# Patient Record
Sex: Male | Born: 1955 | Race: White | Hispanic: No | Smoking: Former smoker
Health system: Southern US, Community
[De-identification: ages and names within clinical notes are randomized; demographics above are authoritative.]

## PROBLEM LIST (undated history)

## (undated) DIAGNOSIS — I5020 Unspecified systolic (congestive) heart failure: Secondary | ICD-10-CM

## (undated) DIAGNOSIS — E785 Hyperlipidemia, unspecified: Secondary | ICD-10-CM

## (undated) DIAGNOSIS — I2510 Atherosclerotic heart disease of native coronary artery without angina pectoris: Secondary | ICD-10-CM

## (undated) DIAGNOSIS — C801 Malignant (primary) neoplasm, unspecified: Secondary | ICD-10-CM

## (undated) DIAGNOSIS — I10 Essential (primary) hypertension: Secondary | ICD-10-CM

## (undated) DIAGNOSIS — R9389 Abnormal findings on diagnostic imaging of other specified body structures: Secondary | ICD-10-CM

## (undated) DIAGNOSIS — I471 Supraventricular tachycardia, unspecified: Secondary | ICD-10-CM

## (undated) DIAGNOSIS — M1990 Unspecified osteoarthritis, unspecified site: Secondary | ICD-10-CM

## (undated) HISTORY — PX: HERNIA REPAIR: SHX51

## (undated) HISTORY — PX: VASECTOMY: SHX75

## (undated) HISTORY — PX: TONSILLECTOMY: SUR1361

---

## 2004-09-26 ENCOUNTER — Encounter: Admission: RE | Admit: 2004-09-26 | Discharge: 2004-09-26 | Payer: Self-pay | Admitting: Family Medicine

## 2011-12-25 ENCOUNTER — Emergency Department (HOSPITAL_COMMUNITY): Payer: BC Managed Care – PPO

## 2011-12-25 ENCOUNTER — Encounter: Payer: Self-pay | Admitting: Emergency Medicine

## 2011-12-25 ENCOUNTER — Other Ambulatory Visit: Payer: Self-pay

## 2011-12-25 ENCOUNTER — Inpatient Hospital Stay (HOSPITAL_COMMUNITY)
Admission: EM | Admit: 2011-12-25 | Discharge: 2011-12-31 | DRG: 550 | Disposition: A | Discharge status: Home / Self Care | Payer: BC Managed Care – PPO | Attending: Internal Medicine | Admitting: Internal Medicine

## 2011-12-25 DIAGNOSIS — I471 Supraventricular tachycardia, unspecified: Secondary | ICD-10-CM

## 2011-12-25 DIAGNOSIS — Z23 Encounter for immunization: Secondary | ICD-10-CM

## 2011-12-25 DIAGNOSIS — Z85828 Personal history of other malignant neoplasm of skin: Secondary | ICD-10-CM

## 2011-12-25 DIAGNOSIS — I43 Cardiomyopathy in diseases classified elsewhere: Secondary | ICD-10-CM | POA: Diagnosis present

## 2011-12-25 DIAGNOSIS — I5021 Acute systolic (congestive) heart failure: Secondary | ICD-10-CM

## 2011-12-25 DIAGNOSIS — Z6834 Body mass index (BMI) 34.0-34.9, adult: Secondary | ICD-10-CM

## 2011-12-25 DIAGNOSIS — R911 Solitary pulmonary nodule: Secondary | ICD-10-CM | POA: Diagnosis present

## 2011-12-25 DIAGNOSIS — I2510 Atherosclerotic heart disease of native coronary artery without angina pectoris: Secondary | ICD-10-CM

## 2011-12-25 DIAGNOSIS — I509 Heart failure, unspecified: Secondary | ICD-10-CM | POA: Diagnosis present

## 2011-12-25 DIAGNOSIS — E871 Hypo-osmolality and hyponatremia: Secondary | ICD-10-CM | POA: Diagnosis not present

## 2011-12-25 DIAGNOSIS — E669 Obesity, unspecified: Secondary | ICD-10-CM

## 2011-12-25 DIAGNOSIS — I214 Non-ST elevation (NSTEMI) myocardial infarction: Principal | ICD-10-CM

## 2011-12-25 DIAGNOSIS — I429 Cardiomyopathy, unspecified: Secondary | ICD-10-CM

## 2011-12-25 DIAGNOSIS — I110 Hypertensive heart disease with heart failure: Secondary | ICD-10-CM | POA: Diagnosis present

## 2011-12-25 DIAGNOSIS — E119 Type 2 diabetes mellitus without complications: Secondary | ICD-10-CM

## 2011-12-25 DIAGNOSIS — M129 Arthropathy, unspecified: Secondary | ICD-10-CM | POA: Diagnosis present

## 2011-12-25 DIAGNOSIS — J438 Other emphysema: Secondary | ICD-10-CM | POA: Diagnosis present

## 2011-12-25 DIAGNOSIS — R079 Chest pain, unspecified: Secondary | ICD-10-CM

## 2011-12-25 DIAGNOSIS — I517 Cardiomegaly: Secondary | ICD-10-CM

## 2011-12-25 HISTORY — DX: Unspecified systolic (congestive) heart failure: I50.20

## 2011-12-25 HISTORY — DX: Unspecified osteoarthritis, unspecified site: M19.90

## 2011-12-25 HISTORY — DX: Atherosclerotic heart disease of native coronary artery without angina pectoris: I25.10

## 2011-12-25 HISTORY — DX: Essential (primary) hypertension: I10

## 2011-12-25 HISTORY — DX: Abnormal findings on diagnostic imaging of other specified body structures: R93.89

## 2011-12-25 HISTORY — DX: Supraventricular tachycardia: I47.1

## 2011-12-25 HISTORY — DX: Hyperlipidemia, unspecified: E78.5

## 2011-12-25 HISTORY — DX: Malignant (primary) neoplasm, unspecified: C80.1

## 2011-12-25 HISTORY — DX: Supraventricular tachycardia, unspecified: I47.10

## 2011-12-25 LAB — CBC
HCT: 40.7 % (ref 39.0–52.0)
MCH: 32.9 pg (ref 26.0–34.0)
MCV: 89.8 fL (ref 78.0–100.0)
Platelets: 211 10*3/uL (ref 150–400)
RDW: 12.3 % (ref 11.5–15.5)
WBC: 10.2 10*3/uL (ref 4.0–10.5)

## 2011-12-25 LAB — GLUCOSE, CAPILLARY: Glucose-Capillary: 318 mg/dL — ABNORMAL HIGH (ref 70–99)

## 2011-12-25 LAB — DIFFERENTIAL
Basophils Absolute: 0 10*3/uL (ref 0.0–0.1)
Eosinophils Absolute: 0 10*3/uL (ref 0.0–0.7)
Eosinophils Relative: 0 % (ref 0–5)
Lymphocytes Relative: 13 % (ref 12–46)
Lymphs Abs: 1.3 10*3/uL (ref 0.7–4.0)
Monocytes Absolute: 0.7 10*3/uL (ref 0.1–1.0)

## 2011-12-25 LAB — CARDIAC PANEL(CRET KIN+CKTOT+MB+TROPI)
CK, MB: 4.9 ng/mL — ABNORMAL HIGH (ref 0.3–4.0)
CK, MB: 6.1 ng/mL (ref 0.3–4.0)
Total CK: 130 U/L (ref 7–232)
Troponin I: 3.58 ng/mL (ref <0.30)
Troponin I: 7.56 ng/mL (ref <0.30)

## 2011-12-25 LAB — BASIC METABOLIC PANEL
CO2: 24 mEq/L (ref 19–32)
Calcium: 9.5 mg/dL (ref 8.4–10.5)
Creatinine, Ser: 0.49 mg/dL — ABNORMAL LOW (ref 0.50–1.35)
GFR calc non Af Amer: >90 mL/min (ref >90)
Glucose, Bld: 295 mg/dL — ABNORMAL HIGH (ref 70–99)
Sodium: 129 mEq/L — ABNORMAL LOW (ref 135–145)

## 2011-12-25 LAB — LIPID PANEL
Cholesterol: 203 mg/dL — ABNORMAL HIGH (ref 0–200)
HDL: 44 mg/dL (ref >39)
LDL Cholesterol: 129 mg/dL — ABNORMAL HIGH (ref 0–99)
Total CHOL/HDL Ratio: 4.6 RATIO
Triglycerides: 152 mg/dL — ABNORMAL HIGH (ref <150)
VLDL: 30 mg/dL (ref 0–40)

## 2011-12-25 LAB — PRO B NATRIURETIC PEPTIDE: Pro B Natriuretic peptide (BNP): 2108 pg/mL — ABNORMAL HIGH (ref 0–125)

## 2011-12-25 MED ORDER — HEPARIN (PORCINE) IN NACL 100-0.45 UNIT/ML-% IJ SOLN: 1000.0000 [IU]/h | INTRAMUSCULAR | Status: DC

## 2011-12-25 MED ORDER — NITROGLYCERIN IN D5W 200-5 MCG/ML-% IV SOLN
10.0000 ug/min | INTRAVENOUS | Status: DC
  Administered 2011-12-25: 10 ug/min via INTRAVENOUS
  Administered 2011-12-25: 20 ug/min via INTRAVENOUS
  Administered 2011-12-25 – 2011-12-26 (×2): 30 ug/min via INTRAVENOUS
  Administered 2011-12-28: 20 ug/min via INTRAVENOUS
  Filled 2011-12-25 (×3): qty 250

## 2011-12-25 MED ORDER — HEPARIN SOD (PORCINE) IN D5W 100 UNIT/ML IV SOLN
INTRAVENOUS | Status: AC
  Administered 2011-12-25: 25000 [IU]
  Filled 2011-12-25: qty 250

## 2011-12-25 MED ORDER — FUROSEMIDE 10 MG/ML IJ SOLN
40.0000 mg | Freq: Once | INTRAMUSCULAR | Status: AC
  Administered 2011-12-25: 40 mg via INTRAVENOUS
  Filled 2011-12-25: qty 4

## 2011-12-25 MED ORDER — ROSUVASTATIN CALCIUM 40 MG PO TABS
40.0000 mg | ORAL_TABLET | Freq: Every day | ORAL | Status: DC
  Administered 2011-12-25 – 2011-12-30 (×6): 40 mg via ORAL
  Filled 2011-12-25 (×7): qty 1

## 2011-12-25 MED ORDER — HEPARIN SOD (PORCINE) IN D5W 100 UNIT/ML IV SOLN
1250.0000 [IU]/h | INTRAVENOUS | Status: DC
  Administered 2011-12-26: 1250 [IU]/h via INTRAVENOUS
  Filled 2011-12-25 (×2): qty 250

## 2011-12-25 MED ORDER — METOPROLOL TARTRATE 1 MG/ML IV SOLN
INTRAVENOUS | Status: AC
  Administered 2011-12-25: 2.5 mg via INTRAVENOUS
  Filled 2011-12-25: qty 5

## 2011-12-25 MED ORDER — OLMESARTAN 10 MG HALF TABLET
10.0000 mg | ORAL_TABLET | Freq: Every day | ORAL | Status: DC
  Administered 2011-12-25 – 2011-12-30 (×6): 10 mg via ORAL
  Filled 2011-12-25 (×7): qty 1

## 2011-12-25 MED ORDER — METOPROLOL TARTRATE 1 MG/ML IV SOLN
2.5000 mg | INTRAVENOUS | Status: DC | PRN
  Administered 2011-12-25: 2.5 mg via INTRAVENOUS

## 2011-12-25 MED ORDER — NITROGLYCERIN IN D5W 200-5 MCG/ML-% IV SOLN
INTRAVENOUS | Status: AC
  Administered 2011-12-25: 20 ug/min via INTRAVENOUS
  Filled 2011-12-25: qty 250

## 2011-12-25 MED ORDER — NITROGLYCERIN IN D5W 200-5 MCG/ML-% IV SOLN
INTRAVENOUS | Status: AC
  Filled 2011-12-25: qty 250

## 2011-12-25 MED ORDER — INSULIN ASPART 100 UNIT/ML ~~LOC~~ SOLN
0.0000 [IU] | Freq: Three times a day (TID) | SUBCUTANEOUS | Status: DC
  Administered 2011-12-26: 8 [IU] via SUBCUTANEOUS
  Administered 2011-12-26: 15 [IU] via SUBCUTANEOUS
  Administered 2011-12-26 – 2011-12-27 (×2): 5 [IU] via SUBCUTANEOUS
  Administered 2011-12-28 (×2): 11 [IU] via SUBCUTANEOUS
  Administered 2011-12-28: 5 [IU] via SUBCUTANEOUS
  Administered 2011-12-29: 11 [IU] via SUBCUTANEOUS
  Administered 2011-12-29: 3 [IU] via SUBCUTANEOUS
  Administered 2011-12-29: 5 [IU] via SUBCUTANEOUS
  Administered 2011-12-30: 8 [IU] via SUBCUTANEOUS
  Administered 2011-12-30: 5 [IU] via SUBCUTANEOUS
  Administered 2011-12-30: 8 [IU] via SUBCUTANEOUS
  Administered 2011-12-31: 3 [IU] via SUBCUTANEOUS
  Administered 2011-12-31: 8 [IU] via SUBCUTANEOUS

## 2011-12-25 MED ORDER — ASPIRIN EC 81 MG PO TBEC
81.0000 mg | DELAYED_RELEASE_TABLET | Freq: Every day | ORAL | Status: DC
  Administered 2011-12-26 – 2011-12-31 (×6): 81 mg via ORAL
  Filled 2011-12-25 (×6): qty 1

## 2011-12-25 MED ORDER — HEPARIN SOD (PORCINE) IN D5W 100 UNIT/ML IV SOLN
INTRAVENOUS | Status: AC
  Filled 2011-12-25: qty 250

## 2011-12-25 MED ORDER — INFLUENZA VIRUS VACC SPLIT PF IM SUSP
0.5000 mL | INTRAMUSCULAR | Status: AC
  Administered 2011-12-26: 0.5 mL via INTRAMUSCULAR
  Filled 2011-12-25: qty 0.5

## 2011-12-25 MED ORDER — METOPROLOL TARTRATE 1 MG/ML IV SOLN
2.5000 mg | INTRAVENOUS | Status: AC
  Administered 2011-12-25 (×2): 2.5 mg via INTRAVENOUS
  Filled 2011-12-25: qty 5

## 2011-12-25 MED ORDER — CARVEDILOL 6.25 MG PO TABS: 6.2500 mg | ORAL_TABLET | Freq: Two times a day (BID) | ORAL | Status: DC

## 2011-12-25 MED ORDER — SODIUM CHLORIDE 0.9 % IV SOLN: INTRAVENOUS | Status: DC

## 2011-12-25 MED ORDER — METOPROLOL TARTRATE 1 MG/ML IV SOLN
5.0000 mg | Freq: Once | INTRAVENOUS | Status: DC
  Filled 2011-12-25: qty 5

## 2011-12-25 MED ORDER — INSULIN ASPART 100 UNIT/ML ~~LOC~~ SOLN
0.0000 [IU] | Freq: Every day | SUBCUTANEOUS | Status: DC
  Administered 2011-12-25: 4 [IU] via SUBCUTANEOUS
  Administered 2011-12-26 – 2011-12-30 (×4): 3 [IU] via SUBCUTANEOUS
  Filled 2011-12-25 (×2): qty 3

## 2011-12-25 MED ORDER — ACETAMINOPHEN 325 MG PO TABS
650.0000 mg | ORAL_TABLET | ORAL | Status: DC | PRN
  Administered 2011-12-26: 650 mg via ORAL
  Filled 2011-12-25: qty 2

## 2011-12-25 MED ORDER — ZOLPIDEM TARTRATE 5 MG PO TABS
5.0000 mg | ORAL_TABLET | Freq: Every evening | ORAL | Status: DC | PRN
  Administered 2011-12-25 – 2011-12-29 (×5): 5 mg via ORAL
  Filled 2011-12-25 (×5): qty 1

## 2011-12-25 MED ORDER — METOPROLOL TARTRATE 12.5 MG HALF TABLET
12.5000 mg | ORAL_TABLET | Freq: Two times a day (BID) | ORAL | Status: DC
  Administered 2011-12-25 – 2011-12-26 (×3): 12.5 mg via ORAL
  Filled 2011-12-25 (×5): qty 1

## 2011-12-25 MED ORDER — NITROGLYCERIN 0.4 MG SL SUBL: 0.4000 mg | SUBLINGUAL_TABLET | SUBLINGUAL | Status: DC | PRN

## 2011-12-25 MED ORDER — IOHEXOL 350 MG/ML SOLN
100.0000 mL | Freq: Once | INTRAVENOUS | Status: AC | PRN
  Administered 2011-12-25: 100 mL via INTRAVENOUS

## 2011-12-25 MED ORDER — ALPRAZOLAM 0.25 MG PO TABS
0.2500 mg | ORAL_TABLET | Freq: Two times a day (BID) | ORAL | Status: DC | PRN
  Administered 2011-12-27 (×2): 0.25 mg via ORAL
  Filled 2011-12-25: qty 1

## 2011-12-25 MED ORDER — ONDANSETRON HCL 4 MG/2ML IJ SOLN: 4.0000 mg | Freq: Four times a day (QID) | INTRAMUSCULAR | Status: DC | PRN

## 2011-12-25 NOTE — Progress Notes (Signed)
CRITICAL VALUE ALERT  Critical value received:  Troponin 7.56, CKMB 6.1 Date of notification:  12/24/10  Time of notification:  1730  Critical value read back: YES  Nurse who received alert:  A Cliffard Hair RN  MD notified (1st page):  Graciela Husbands  Time of first page:  1730  MD notified (2nd page):  Time of second page:  Responding MD:  Graciela Husbands  Time MD responded: [phone]

## 2011-12-25 NOTE — ED Notes (Signed)
Pt reports starting to feel better. Less diaphoretic, denies pain. Family at bedside. Waiting for bed assignment to floor. No needs at this time.

## 2011-12-25 NOTE — ED Notes (Signed)
[name], [name] at bedside. Pt stating it is hard to breathe. O2 Sats increased to 4L [place]. O2 Sats 95%. Pt denies chest pain.

## 2011-12-25 NOTE — Progress Notes (Signed)
Patient ID: [name], male   DOB: Oct 15, 1956, 56 y.o.   MRN: [id number] Patient looks much more stable. No current chest pain. Enzymes are modestly elevated, but CKMB only 6.  EF 20%-25% with global hypokinesis.   Will continue diuresis, and add low dose beta blockade.   Defer cath for now until hemodynamics are stabilized.  He prob has HTN related LV dysfunction plus or minus viral component of myocarditis. Cannot exclude CAD, and will need cath study.   6:32 PM Shawnie Pons 12/25/2011

## 2011-12-25 NOTE — ED Notes (Signed)
Troponin 3.58 per Fayrene Fearing, [employer]. Will inform MD Graciela Husbands.

## 2011-12-25 NOTE — ED Notes (Signed)
cbg -318 on glucometer.

## 2011-12-25 NOTE — Progress Notes (Signed)
Pt SOB improved after IV lasix. Patient has diuresed approximately 1/2 L. He was also placed on a nonrebreather mask. Currently he feels that his respiratory status is significantly improved. He is no longer diaphoretic. He is maintaining O2 sats at 100%. Preliminary echocardiogram results show an EF of 10-20%. These results were reviewed by Dr. Graciela Husbands who re-assessed the patient.  [name] has acute systolic CHF. He has had IV nitroglycerin started which will be up titrated for better blood pressure control. 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 Right and left heart cath can be considered at a later date, once his general medical condition is improved.  He has been placed on a diabetic diet because of a glucose of 295. Hemoglobin A1c is pending. Will use sliding scale insulin as well.  We will continue to follow him closely.

## 2011-12-25 NOTE — Progress Notes (Signed)
ANTICOAGULATION CONSULT NOTE - Initial Consult  Pharmacy Consult for: Heparin Indication: chest pain/ACS  No Known Allergies  Patient Measurements: Height: 5\' 10"  (177.8 cm) Weight: 236 lb 12.4 oz (107.4 kg) IBW/kg (Calculated) : 73  Heparin dosing weight: 96kg  Vital Signs: Temp: 98.7 F (37.1 C) (01/02 1955) Temp src: Oral (01/02 1955) BP: 130/70 mmHg (01/02 1800) Pulse Rate: 117  (01/02 1900)  Labs:  Basename 12/25/11 1633 12/25/11 0904 12/25/11 0731  HGB -- -- 14.9  HCT -- -- 40.7  PLT -- -- 211  APTT -- -- --  LABPROT -- -- --  INR -- -- --  HEPARINUNFRC 0.12* -- --  CREATININE -- -- 0.49*  CKTOTAL 130 159 --  CKMB 6.1* 4.9* --  TROPONINI 7.56* 3.58* --   Estimated Creatinine Clearance: 126.6 ml/min (by C-G formula based on Cr of 0.49).  Medical History: Past Medical History  Diagnosis Date  . 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 Hypertension     9 yrs ago, stopped smoking and B/P went down  . Arthritis     Medications:  Prescriptions prior to admission  Medication Sig Dispense Refill  . aspirin 81 MG chewable tablet Chew 324 mg by mouth once.        . Loratadine-Pseudoephedrine (CLARITIN-D 12 HOUR PO) Take 1 tablet by mouth daily.        . nitroGLYCERIN (NITROSTAT) 0.4 MG SL tablet Place 0.4 mg under the tongue every 5 (five) minutes as needed. Chest pain         Assessment: 55yom started on heparin in the ED for CP. His initial Heparin level has come back at 0.12 which is sub-therapeutic and below desired goal range.  He is without noted bleeding complications with therapy.  His cardiac enzymes were elevated and he has had CHF exacerbation and currently being diuresed.  Goal of Therapy:  Heparin level 0.3-0.7 units/ml   Plan 1)  Will increase IV heparin to 1200 units/hr for now 2)  Check AM heparin level and adjust as needed. 3)  F/U plans for continued IV heparin therapy 4)  Monitor for signs of bleeding   Nadara Mustard PharmD., MS Clinical  Pharmacist Pager:  [phone] 12/25/2011,8:48 PM

## 2011-12-25 NOTE — ED Notes (Signed)
Pt c/o SOB. O2 increased. Pt diaphoretic.  Alan Ripper, [facility] aware and at bedside with pt.

## 2011-12-25 NOTE — Progress Notes (Signed)
  Echocardiogram 2D Echocardiogram has been performed.  Juanita Laster Saje Gallop, RDCS 12/25/2011, 12:13 PM

## 2011-12-25 NOTE — H&P (Signed)
History and Physical  Patient ID: [name] Patient ID: [name] MRN: [id number], DOB/AGE: 57-Aug-1957 57 y.o. Date of Encounter: 12/25/2011  Primary Physician: [facility] Primary Cardiologist: none  Chief Complaint: chest pain  HPI: [name] patient is a 71 her old male with no previous history of coronary artery disease. Over the last 2 weeks or so, he has felt general malaise and bodyaches but no fever. He has also had nasal congestion. He attributed his symptoms to possibly a mild case of the flu or allergies. His appetite has also been poor. Last p.m. he was actually feeling a little better and says he well. The medial included some ham but he does not feel that he had an excessive salt load.  The patient awakened at approximately midnight with shortness of breath. The shortness of breath was severe. He then developed chest tightness and pressure at a 9/10. He was also diaphoretic. He had some nausea but no vomiting. When his symptoms did not resolve, he called EMS. EMS gave him aspirin 81 mg x4 and sublingual nitroglycerin x2. By the time he arrived in the emergency room, his chest pain was relieved. Since being in the emergency room, his chest pain has not returned. He is on heparin, oxygen and has received IV Lopressor. His shortness of breath has resolved. He is no longer diaphoretic or nauseated.   He has never had these symptoms before. The only similar episode, was several years ago in the setting of a severe chest cold. Once his chest cold improved, all other symptoms resolved. 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 Currently he is resting comfortably.  Past medical history.  1. Hypertension: He was previously hypertensive but he quit smoking and lost 30 pounds so his hypertension improved to the point that medication was not require. 2. Cholesterol: His cholesterol  was checked about a year ago and the patient states that he was never told it was high.   Surgical History: History reviewed. No pertinent past surgical history.   Meds PTA:  Claritin D daily PRN  Allergies: No Known Allergies  History   Social History  . Marital Status: Divorced    Spouse Name: N/A    Number of Children: N/A  . Years of Education: N/A   Occupational History  . IT   Social History Main Topics  . Smoking status:  he quit smoking 9 years ago   . Smokeless tobacco: Not on file  . Alcohol Use: No abuse   . Drug Use: No  . Sexually Active: Not on file   Other Topics Concern  . Not on file   Social History Narrative  .  lives with significant other     Cardiovascular ROS: negative for - other organ systems apart from was listed above  Family history. His mother is alive in her late 15s without cardiac issues. His father died in his 63s from cirrhosis with no cardiac issues. He has brothers with hypertension but no cardiac issues. He has a grandmother who had a heart attack in her 26s.  Physical Exam: Blood pressure 160/93, pulse 118, temperature 98.1 F (36.7 C), temperature source Oral, resp. rate 19, SpO2 96.00%. General: Well developed, overweight white male, in no acute distress. Head: Normocephalic, atraumatic, sclera non-icteric, no xanthomas, nares are without discharge.  Neck: Negative for carotid bruits. JVD not elevated, but difficult  to assess secondary to body habitus. No thyromegally Lungs: He has bibasilar Rales. Breathing is unlabored. Heart: Rapid but RR with S1 S2. No murmurs, rubs, or gallops appreciated. Abdomen: Soft, non-tender, non-distended with normoactive bowel sounds. No hepatomegaly. No rebound/guarding. No obvious abdominal masses. Msk:  Strength and tone appear normal for age. Extremities: No clubbing or cyanosis. No edema.  Distal pedal pulses are 2+ and equal bilaterally. Neuro: Alert and oriented X 3. Moves all extremities  spontaneously. No focal deficits noted. Psych:  Responds to questions appropriately with a normal affect. Skin: No rashes or lesions noted LN  Back without kyphosis or scoliosis  Review of Systems: He has had some musculoskeletal aches and pains. He had general malaise recently. He has not had fevers or chills. He has had a poor appetite recently. He is not aware of any edema or weight loss. He states that his legs have been feeling cold recently. He has not had any cough or cold symptoms but has had nasal congestion. All other systems reviewed and are otherwise negative except as noted above.  Labs:   Lab Results  Component Value Date   WBC 10.2 12/25/2011   HGB 14.9 12/25/2011   HCT 40.7 12/25/2011   MCV 89.8 12/25/2011   PLT 211 12/25/2011    Lab 12/25/11 0731  NA 129*  K 4.5  CL 92*  CO2 24  BUN 10  CREATININE 0.49*  CALCIUM 9.5  PROT --  BILITOT --  ALKPHOS --  ALT --  AST --  GLUCOSE 295*   POC Troponin 2.63, repeat enzymes pending.   Radiology/Studies: pending   EKG: ST, lateral ST depression, no old available  ASSESSMENT AND PLAN:  1. chest pain: Brian. English is a 56 year old male with no previous history of coronary artery disease who had prolonged chest pain and has an elevated troponin, repeat enzymes pending. The risks and benefits of a cardiac catheterization including, but not limited to, death, stroke, MI, kidney damage and bleeding were discussed with the patient who indicates understanding and agrees to proceed. Cardiac catheterization is planned for tomorrow.  2. Shortness of breath: Because he is consistently tachycardic and his symptoms began with shortness of breath, CT angiogram of the chest will be performed to rule out PE.  3. Hyperglycemia: We will check a hemoglobin A1c. We will start him on a diabetic diet and encourage him to followup with primary care.  4. Cholesterol: His cholesterol status has not been checked recently. We will check a lipid profile  follow up on those results.  Plan: If the CT angiogram is negative for PE, he'll be admitted by cardiology.     Signed, Bjorn Loser Barrett PA-C 12/25/2011, 8:47 AM  The patient has concerning chest pain with a troponin of 2.6 her initial evaluation. He is currently pain-free. His tachycardia is bothersome and shortness of breath was his initial symptoms. As such, we will exclude pulmonary embolism and continue anti-coagulant therapy. If CT scan is negative, we'll pursue catheterization probably in the morning if he remains pain-free. His electrocardiogram has evidence of ST segment elevation in leads V1-V3 admission to the same in leads 3. The former could  be J-point elevation/early repolarization; this will become clear.  Risk factor modification will be important. He does not smoke. He is significantly obese. His elevated blood sugar and will need diabetes education. Will need to assess his lipid profile.  For now, we will continue him on aspirin, heparin, beta blockers, statins therapy and began augmented  antiplatelet therapy depending on the results of the CT scan

## 2011-12-25 NOTE — ED Provider Notes (Signed)
History     CSN: [id number]  Arrival date & time 12/25/11  4098   First MD Initiated Contact with Patient 12/25/11 [phone]      Chief Complaint  Patient presents with  . Chest Pain    (Consider location/radiation/quality/duration/timing/severity/associated sxs/prior treatment) HPI Comments: Patient reports that at midnight last evening he began having chest pain and shortness of breath.  He describes the pain as a pressure.  The pain did not radiate.  He reports that the pain was associated with shortness of breath and diaphoresis.  This morning he called EMS and was brought to the ED.  He was given Aspirin 324 mg and 2 Nitroglycerin by EMS, which helped his symptoms.  He reports that prior to the medication his chest pain was a 8/10 and now it is less than a 1/10.  He states that he does not have any cardiac illnesses.  He states that he had borderline hypertension in the past, but has been controlling it with diet.  He is a previous smoker.  He smoked 1ppd for approximately 30 years. He quit smoking 9 years ago.  He does not have a PCP or a Cardiologist.   He states that he does not a family history of MI or stroke.  He does have a family history of HTN.  Patient is a 56 y.o. male presenting with chest pain. The history is provided by the patient.  Chest Pain Episode onset: 7 hours ago. Duration of episode(s) is 6 hours. Chest pain occurs constantly. At its most intense, the pain is at 8/10. The pain is currently at 1/10. The quality of the pain is described as pressure-like. The pain does not radiate. Primary symptoms include shortness of breath and cough. Pertinent negatives for primary symptoms include no fever, no nausea, no vomiting and no dizziness.  Associated symptoms include diaphoresis.  Pertinent negatives for associated symptoms include no lower extremity edema, no near-syncope, no numbness and no weakness. Risk factors include male gender and obesity.  Pertinent negatives for past  medical history include no CAD, no COPD, no hypertension, no MI and no PE.     History reviewed. No pertinent past medical history.  History reviewed. No pertinent past surgical history.  History reviewed. No pertinent family history.  History  Substance Use Topics  . Smoking status: Not on file  . Smokeless tobacco: Not on file  . Alcohol Use: Not on file      Review of Systems  Constitutional: Positive for diaphoresis. Negative for fever and chills.  HENT: Negative for neck pain and neck stiffness.   Respiratory: Positive for cough and shortness of breath.   Cardiovascular: Positive for chest pain. Negative for near-syncope.  Gastrointestinal: Negative for nausea and vomiting.  Neurological: Negative for dizziness, weakness, light-headedness and numbness.  Psychiatric/Behavioral: Negative for confusion.    Allergies  Review of patient's allergies indicates not on file.  Home Medications  No current outpatient prescriptions on file.  BP 196/112  Pulse 133  Temp(Src) 98.1 F (36.7 C) (Oral)  Resp 22  SpO2 96%  Physical Exam  Constitutional: He is oriented to person, place, and time. He appears well-developed and well-nourished. No distress.  HENT:  Head: Normocephalic and atraumatic.  Neck: Normal range of motion. Neck supple.  Cardiovascular: Normal rate, regular rhythm and normal heart sounds.   Pulmonary/Chest: Effort normal and breath sounds normal. No respiratory distress. He has no wheezes.  Abdominal: Soft. Bowel sounds are normal. There is no tenderness.  Musculoskeletal: Normal range of motion.  Neurological: He is alert and oriented to person, place, and time.  Skin: Skin is warm and dry.  Psychiatric: He has a normal mood and affect.    ED Course  Procedures (including critical care time)  Results for orders placed during the hospital encounter of 12/25/11  CBC      Component Value Range   WBC 10.2  4.0 - 10.5 (K/uL)   RBC 4.53  4.22 - 5.81  (MIL/uL)   Hemoglobin 14.9  13.0 - 17.0 (g/dL)   HCT 16.1  09.6 - 04.5 (%)   MCV 89.8  78.0 - 100.0 (fL)   MCH 32.9  26.0 - 34.0 (pg)   MCHC 36.6 (*) 30.0 - 36.0 (g/dL)   RDW 40.9  81.1 - 91.4 (%)   Platelets 211  150 - 400 (K/uL)  DIFFERENTIAL      Component Value Range   Neutrophils Relative 80 (*) 43 - 77 (%)   Neutro Abs 8.2 (*) 1.7 - 7.7 (K/uL)   Lymphocytes Relative 13  12 - 46 (%)   Lymphs Abs 1.3  0.7 - 4.0 (K/uL)   Monocytes Relative 7  3 - 12 (%)   Monocytes Absolute 0.7  0.1 - 1.0 (K/uL)   Eosinophils Relative 0  0 - 5 (%)   Eosinophils Absolute 0.0  0.0 - 0.7 (K/uL)   Basophils Relative 0  0 - 1 (%)   Basophils Absolute 0.0  0.0 - 0.1 (K/uL)  BASIC METABOLIC PANEL      Component Value Range   Sodium 129 (*) 135 - 145 (mEq/L)   Potassium 4.5  3.5 - 5.1 (mEq/L)   Chloride 92 (*) 96 - 112 (mEq/L)   CO2 24  19 - 32 (mEq/L)   Glucose, Bld 295 (*) 70 - 99 (mg/dL)   BUN 10  6 - 23 (mg/dL)   Creatinine, Ser 7.82 (*) 0.50 - 1.35 (mg/dL)   Calcium 9.5  8.4 - 95.6 (mg/dL)   GFR calc non Af Amer >90  >90 (mL/min)   GFR calc Af Amer >90  >90 (mL/min)  POCT I-STAT TROPONIN I      Component Value Range   Troponin i, poc 2.63 (*) 0.00 - 0.08 (ng/mL)   Comment NOTIFIED PHYSICIAN     Comment 3            No results found. '   Date: 12/25/2011  Rate: 126  Rhythm: sinus tachycardia  QRS Axis: normal  Intervals: normal  ST/T Wave abnormalities: ST depressions laterally  Conduction Disutrbances:none  Narrative Interpretation:    Old EKG Reviewed: none available  8:16 AM Discussed with Cardiology. [place] Dr. Graciela Husbands on call, will evaluate now in the Ed. CXR pending.   MDM   Chest pain with no ACS risk factors other than male gender and age. Aspirin prior to arrival. Nitroglycerin in route relieved pain. Stat EKG demonstrates ST depressions laterally as above. Patient tachycardic with shortness of breath, so an PE study was ordered initially. With elevated troponin felt to  be more likely cardiac in nature. CT was canceled and cardiology consult at and evaluated bedside. At 8:45 AM working plan is to heparin, admit to cardiology and set up for cath UA today or tomorrow. Will repeat EKG for any return of chest pain and cycle cardiac enzymes.       Pascal Lux Wingen 12/25/11 2130  Pascal Lux Wingen 12/25/11 0830  Medical screening examination/treatment/procedure(s) were conducted as a  shared visit with non-physician practitioner(s) and myself.  I personally evaluated the patient during the encounter  Sunnie Nielsen, MD 12/25/11 [phone]

## 2011-12-25 NOTE — ED Notes (Signed)
Cardiology PA at bedside. 

## 2011-12-25 NOTE — Progress Notes (Signed)
ANTICOAGULATION CONSULT NOTE - Initial Consult  Pharmacy Consult for: Heparin Indication: chest pain/ACS  No Known Allergies  Patient Measurements: Height: 5\' 10"  (177.8 cm) Weight: 236 lb 12.4 oz (107.4 kg) IBW/kg (Calculated) : 73  Heparin dosing weight: 96kg  Vital Signs: Temp: 98.3 F (36.8 C) (01/02 1101) Temp src: Oral (01/02 0757) BP: 128/74 mmHg (01/02 1300) Pulse Rate: 111  (01/02 1300)  Labs:  Basename 12/25/11 0904 12/25/11 0731  HGB -- 14.9  HCT -- 40.7  PLT -- 211  APTT -- --  LABPROT -- --  INR -- --  HEPARINUNFRC -- --  CREATININE -- 0.49*  CKTOTAL 159 --  CKMB 4.9* --  TROPONINI 3.58* --   Estimated Creatinine Clearance: 126.6 ml/min (by C-G formula based on Cr of 0.49).  Medical History: Past Medical History  Diagnosis Date  . 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 Hypertension     9 yrs ago, stopped smoking and B/P went down  . Arthritis     Medications:  Prescriptions prior to admission  Medication Sig Dispense Refill  . aspirin 81 MG chewable tablet Chew 324 mg by mouth once.        . Loratadine-Pseudoephedrine (CLARITIN-D 12 HOUR PO) Take 1 tablet by mouth daily.        . nitroGLYCERIN (NITROSTAT) 0.4 MG SL tablet Place 0.4 mg under the tongue every 5 (five) minutes as needed. Chest pain         Assessment: 55yom started on heparin in the ED for CP. Heparin started at 1000 units/hr (no bolus that I see documented and no mention of bolus given when RN took report). Given his weight, he probably will need a rate increase but will check level first.  Goal of Therapy:  Heparin level 0.3-0.7 units/ml   Plan:  1) Continue heparin at 1000 units/hr for now 2) Check 6h level (1500 today since heparin started ~0900 in ED)  Fredrik Rigger 12/25/2011,2:08 PM

## 2011-12-25 NOTE — ED Notes (Signed)
Patient woke up around midnight last night with substernal chest pain; pain progressively gotten worse over the course of the morning.  Patient also reports associated symptoms: severe dyspnea and diaphoresis.  Denies nausea, vomiting, blurred vision, and numbness/tingling in hands/feet.  Initial chest pain 8/10; describes location as "substernal"; describes chest pain as a "tight pressure".  Upon EMS arrival, patient placed on non-rebreather mask.  Patient given 324 ASA and 2 nitro by EMS.  IV started in left hand (22 GA).  Chest pain now 1/10 upon arrival to hospital.

## 2011-12-25 NOTE — ED Notes (Signed)
MD Sudie Bailey, PA at bedside. Echo Tech at bedside to perform 2D Echo.

## 2011-12-25 NOTE — Progress Notes (Signed)
Pt with recurrent sob and diaphoresis but no CP And repeat ECG continues unchanged CT no PE and no pericardial effusion   PE also shows rales with BP now 170/100 We are doing a stat echo and have discussed with INterventional about urgent cath.  I wonder whether the dye load from CT has tipped into more heart failure  Rx now with diuretics and NTG; wioll hold off on BB with CHF

## 2011-12-26 DIAGNOSIS — E119 Type 2 diabetes mellitus without complications: Secondary | ICD-10-CM

## 2011-12-26 DIAGNOSIS — E669 Obesity, unspecified: Secondary | ICD-10-CM

## 2011-12-26 DIAGNOSIS — I428 Other cardiomyopathies: Secondary | ICD-10-CM

## 2011-12-26 DIAGNOSIS — I429 Cardiomyopathy, unspecified: Secondary | ICD-10-CM | POA: Insufficient documentation

## 2011-12-26 DIAGNOSIS — I214 Non-ST elevation (NSTEMI) myocardial infarction: Secondary | ICD-10-CM

## 2011-12-26 DIAGNOSIS — I5021 Acute systolic (congestive) heart failure: Secondary | ICD-10-CM

## 2011-12-26 LAB — CBC
HCT: 37.3 % — ABNORMAL LOW (ref 39.0–52.0)
MCV: 90.5 fL (ref 78.0–100.0)
RDW: 12.4 % (ref 11.5–15.5)
WBC: 9.6 10*3/uL (ref 4.0–10.5)

## 2011-12-26 LAB — BASIC METABOLIC PANEL
BUN: 12 mg/dL (ref 6–23)
CO2: 27 mEq/L (ref 19–32)
Calcium: 9.2 mg/dL (ref 8.4–10.5)
Creatinine, Ser: 0.58 mg/dL (ref 0.50–1.35)
Glucose, Bld: 270 mg/dL — ABNORMAL HIGH (ref 70–99)
Sodium: 136 mEq/L (ref 135–145)

## 2011-12-26 LAB — HEPARIN LEVEL (UNFRACTIONATED): Heparin Unfractionated: <0.1 IU/mL — ABNORMAL LOW (ref 0.30–0.70)

## 2011-12-26 LAB — GLUCOSE, CAPILLARY: Glucose-Capillary: 352 mg/dL — ABNORMAL HIGH (ref 70–99)

## 2011-12-26 MED ORDER — SODIUM CHLORIDE 0.9 % IV SOLN
1.0000 mL/kg/h | INTRAVENOUS | Status: DC
  Administered 2011-12-27: 1.003 mL/kg/h via INTRAVENOUS

## 2011-12-26 MED ORDER — HEPARIN SOD (PORCINE) IN D5W 100 UNIT/ML IV SOLN
2000.0000 [IU]/h | INTRAVENOUS | Status: DC
  Administered 2011-12-26: 1500 [IU]/h via INTRAVENOUS
  Administered 2011-12-26: 1800 [IU]/h via INTRAVENOUS
  Administered 2011-12-27: 2000 [IU]/h via INTRAVENOUS
  Filled 2011-12-26 (×5): qty 250

## 2011-12-26 MED ORDER — SODIUM CHLORIDE 0.9 % IV SOLN: 250.0000 mL | INTRAVENOUS | Status: DC | PRN

## 2011-12-26 MED ORDER — HEPARIN BOLUS VIA INFUSION
3000.0000 [IU] | Freq: Once | INTRAVENOUS | Status: AC
  Administered 2011-12-26: 3000 [IU] via INTRAVENOUS
  Filled 2011-12-26: qty 3000

## 2011-12-26 MED ORDER — SODIUM CHLORIDE 0.9 % IJ SOLN
3.0000 mL | Freq: Two times a day (BID) | INTRAMUSCULAR | Status: DC
  Administered 2011-12-26 – 2011-12-27 (×3): 3 mL via INTRAVENOUS

## 2011-12-26 MED ORDER — SODIUM CHLORIDE 0.9 % IJ SOLN: 3.0000 mL | INTRAMUSCULAR | Status: DC | PRN

## 2011-12-26 MED ORDER — FUROSEMIDE 10 MG/ML IJ SOLN
40.0000 mg | Freq: Once | INTRAMUSCULAR | Status: AC
  Administered 2011-12-26: 40 mg via INTRAVENOUS
  Filled 2011-12-26: qty 4

## 2011-12-26 NOTE — Progress Notes (Signed)
  Patient Name: [name]      SUBJECTIVE:mcuh improved this am with less sob; no chest pain  Past Medical History  Diagnosis Date  . 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 Hypertension     9 yrs ago, stopped smoking and B/P went down  . Arthritis     PHYSICAL EXAM Filed Vitals:   12/26/11 0500 12/26/11 0600 12/26/11 0700 12/26/11 0744  BP: 120/68 125/73 117/76 117/76  Pulse: 89 94 93 100  Temp:    97.9 F (36.6 C)  TempSrc:      Resp: 17 16 12 14   Height:      Weight: 235 lb 3.7 oz (106.7 kg)     SpO2: 94% 97% 95% 95%    General appearance: alert, appears stated age, no distress and moderately obese Neck: no carotid bruit and no JVD Lungs: clear to auscultation bilaterally Heart: regular rate and rhythm, S1, S2 normal, no murmur, click, rub or gallop Abdomen: soft, non-tender; bowel sounds normal; no masses,  no organomegaly Extremities: edema none Pulses: 2+ and symmetric Skin: Skin color, texture, turgor normal. No rashes or lesions Neurologic: Alert and oriented X 3, normal strength and tone. Normal symmetric reflexes. Normal coordination and gait   TELEMETRY: Reviewed telemetry pt in NSR:    Intake/Output Summary (Last 24 hours) at 12/26/11 1124 Last data filed at 12/26/11 0920  Gross per 24 hour  Intake 1244.67 ml  Output   4850 ml  Net -3605.33 ml    LABS: Basic Metabolic Panel:  Lab 12/26/11 4098 12/25/11 0731  NA 136 129*  K 3.5 4.5  CL 96 92*  CO2 27 24  GLUCOSE 270* 295*  BUN 12 10  CREATININE 0.58 0.49*  CALCIUM 9.2 9.5  MG -- --  PHOS -- --   Cardiac Enzymes:  Basename 12/25/11 2330 12/25/11 1633 12/25/11 0904  CKTOTAL 120 130 159  CKMB 4.4* 6.1* 4.9*  CKMBINDEX -- -- --  TROPONINI 7.65* 7.56* 3.58*   CBC:  Lab 12/26/11 0547 12/25/11 0731  WBC 9.6 10.2  NEUTROABS -- 8.2*  HGB 13.5 14.9  HCT 37.3* 40.7  MCV 90.5 89.8  PLT 199 211     Basename 12/25/11 1540  HGBA1C 9.6*   Fasting Lipid Panel:  Basename 12/25/11  1539  CHOL 203*  HDL 44  LDLCALC 129*  TRIG 152*  CHOLHDL 4.6  LDLDIRECT --   Thyroid Function Tests:  Basename 12/25/11 1147  TSH 1.528  T4TOTAL --  T3FREE --  THYROIDAB --     ASSESSMENT AND PLAN:  Acute systolic heart failure with Tn>7 in setting of 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  Did have antecedent viral syndrome  For now,  1) anticipate cath in am 2) diabetes consult 3) betablockers Change to coreg 4) continue arb may be preferentially beneficial in myocarditis 5) continue Lasix/hep/NTG 6) out pt sleep study   Patient Active Hospital Problem List: Acute systolic congestive heart failure (12/26/2011)     Cardiomyopathy  LVH ? hypertensive (12/26/2011)   NSTEMI (non-ST elevated myocardial infarction) (12/26/2011)   Obesity (12/26/2011)   Type II or unspecified type diabetes mellitus without mention of complication, not stated as uncontrolled (12/26/2011)      Signed, Sherryl Manges MD  12/26/2011

## 2011-12-26 NOTE — Progress Notes (Signed)
Inpatient Diabetes Program Recommendations  AACE/ADA: New Consensus Statement on Inpatient Glycemic Control (2009)  Target Ranges:  Prepandial:   less than 140 mg/dL      Peak postprandial:   less than 180 mg/dL (1-2 hours)      Critically ill patients:  140 - 180 mg/dL   R6E=4.5 no prior history of DM?  MD please make diagnosis so patient education can start.    Inpatient Diabetes Program Recommendations Insulin - Basal: Lantus 10 units

## 2011-12-26 NOTE — Progress Notes (Signed)
ANTICOAGULATION CONSULT NOTE - Follow Up Consult  Pharmacy Consult for: Heparin Indication: chest pain/ACS  No Known Allergies  Vital Signs: Temp: 97.8 F (36.6 C) (01/03 1130) Temp src: Oral (01/03 1130) BP: 132/78 mmHg (01/03 1300) Pulse Rate: 99  (01/03 1300)  Labs:  Basename 12/26/11 1624 12/26/11 0547 12/25/11 2330 12/25/11 1633 12/25/11 0904 12/25/11 0731  HGB -- 13.5 -- -- -- 14.9  HCT -- 37.3* -- -- -- 40.7  PLT -- 199 -- -- -- 211  APTT -- -- -- -- -- --  LABPROT -- -- -- -- -- --  INR -- -- -- -- -- --  HEPARINUNFRC <0.10* <0.10* -- 0.12* -- --  CREATININE -- 0.58 -- -- -- 0.49*  CKTOTAL -- -- 120 130 159 --  CKMB -- -- 4.4* 6.1* 4.9* --  TROPONINI -- -- 7.65* 7.56* 3.58* --   Estimated Creatinine Clearance: 126.1 ml/min (by C-G formula based on Cr of 0.58).   Medications:  Heparin @ 1250 units/hr  Assessment: 55yom continues on heparin for CP. Heparin level remains undetectable < 0.1  Goal of Therapy:  Heparin level 0.3-0.7 units/ml   Plan:  1) Re-bolus heparin 3000 units x 1 2) Increase heparin to 1800 units/hr   Merilynn Finland, [employer] 12/26/2011,5:32 PM

## 2011-12-26 NOTE — Progress Notes (Signed)
ANTICOAGULATION CONSULT NOTE - Follow Up Consult  Pharmacy Consult for: Heparin Indication: chest pain/ACS  No Known Allergies  Vital Signs: Temp: 97.9 F (36.6 C) (01/03 0744) Temp src: Oral (01/03 0348) BP: 117/76 mmHg (01/03 0744) Pulse Rate: 100  (01/03 0744)  Labs:  Basename 12/26/11 0547 12/25/11 2330 12/25/11 1633 12/25/11 0904 12/25/11 0731  HGB 13.5 -- -- -- 14.9  HCT 37.3* -- -- -- 40.7  PLT 199 -- -- -- 211  APTT -- -- -- -- --  LABPROT -- -- -- -- --  INR -- -- -- -- --  HEPARINUNFRC <0.10* -- 0.12* -- --  CREATININE 0.58 -- -- -- 0.49*  CKTOTAL -- 120 130 159 --  CKMB -- 4.4* 6.1* 4.9* --  TROPONINI -- 7.65* 7.56* 3.58* --   Estimated Creatinine Clearance: 126.1 ml/min (by C-G formula based on Cr of 0.58).   Medications:  Heparin @ 1250 units/hr  Assessment: 55yom continues on heparin for CP. Heparin level this morning is undetectable. No issues with infusion noted. No bleeding.  Goal of Therapy:  Heparin level 0.3-0.7 units/ml   Plan:  1) Re-bolus heparin 3000 units x 1 2) Increase heparin to 1500 units/hr 3) 6 hour heparin level vs f/u after cath if planned today  Fredrik Rigger 12/26/2011,8:34 AM

## 2011-12-27 ENCOUNTER — Encounter (HOSPITAL_COMMUNITY)
Admission: EM | Disposition: A | Discharge status: Home / Self Care | Payer: Self-pay | Source: Home / Self Care | Attending: Internal Medicine

## 2011-12-27 ENCOUNTER — Other Ambulatory Visit: Payer: Self-pay

## 2011-12-27 DIAGNOSIS — I2510 Atherosclerotic heart disease of native coronary artery without angina pectoris: Secondary | ICD-10-CM

## 2011-12-27 HISTORY — PX: PERCUTANEOUS CORONARY STENT INTERVENTION (PCI-S): SHX5485

## 2011-12-27 HISTORY — PX: LEFT AND RIGHT HEART CATHETERIZATION WITH CORONARY ANGIOGRAM: SHX5449

## 2011-12-27 LAB — CBC
Hemoglobin: 12.9 g/dL — ABNORMAL LOW (ref 13.0–17.0)
MCH: 32.3 pg (ref 26.0–34.0)
MCV: 89 fL (ref 78.0–100.0)
Platelets: 193 10*3/uL (ref 150–400)
RBC: 3.99 MIL/uL — ABNORMAL LOW (ref 4.22–5.81)
RDW: 12.2 % (ref 11.5–15.5)
WBC: 8.7 10*3/uL (ref 4.0–10.5)
WBC: 7.2 10*3/uL (ref 4.0–10.5)

## 2011-12-27 LAB — GLUCOSE, CAPILLARY: Glucose-Capillary: 297 mg/dL — ABNORMAL HIGH (ref 70–99)

## 2011-12-27 LAB — HEPARIN LEVEL (UNFRACTIONATED): Heparin Unfractionated: 0.26 IU/mL — ABNORMAL LOW (ref 0.30–0.70)

## 2011-12-27 LAB — POCT I-STAT 3, VENOUS BLOOD GAS (G3P V)
O2 Saturation: 61 %
pCO2, Ven: 43.4 mmHg — ABNORMAL LOW (ref 45.0–50.0)
pH, Ven: 7.372 — ABNORMAL HIGH (ref 7.250–7.300)
pO2, Ven: 33 mmHg (ref 30.0–45.0)

## 2011-12-27 LAB — BASIC METABOLIC PANEL
CO2: 29 mEq/L (ref 19–32)
Calcium: 9 mg/dL (ref 8.4–10.5)
Chloride: 93 mEq/L — ABNORMAL LOW (ref 96–112)
Creatinine, Ser: 0.5 mg/dL (ref 0.50–1.35)
Glucose, Bld: 247 mg/dL — ABNORMAL HIGH (ref 70–99)

## 2011-12-27 LAB — POCT I-STAT 3, ART BLOOD GAS (G3+)
TCO2: 28 mmol/L (ref 0–100)
pCO2 arterial: 40.1 mmHg (ref 35.0–45.0)
pH, Arterial: 7.429 (ref 7.350–7.450)

## 2011-12-27 LAB — PROTIME-INR: Prothrombin Time: 14.4 seconds (ref 11.6–15.2)

## 2011-12-27 LAB — CREATININE, SERUM
Creatinine, Ser: 0.52 mg/dL (ref 0.50–1.35)
GFR calc Af Amer: >90 mL/min (ref >90)

## 2011-12-27 LAB — POCT ACTIVATED CLOTTING TIME: Activated Clotting Time: 468 seconds

## 2011-12-27 SURGERY — LEFT AND RIGHT HEART CATHETERIZATION WITH CORONARY ANGIOGRAM
Anesthesia: LOCAL

## 2011-12-27 MED ORDER — NITROGLYCERIN 0.2 MG/ML ON CALL CATH LAB
INTRAVENOUS | Status: AC
  Filled 2011-12-27: qty 1

## 2011-12-27 MED ORDER — SODIUM CHLORIDE 0.9 % IV SOLN: 1.0000 mL/kg/h | INTRAVENOUS | Status: AC

## 2011-12-27 MED ORDER — HEPARIN SODIUM (PORCINE) 5000 UNIT/ML IJ SOLN
5000.0000 [IU] | Freq: Three times a day (TID) | INTRAMUSCULAR | Status: DC
  Administered 2011-12-28 – 2011-12-31 (×11): 5000 [IU] via SUBCUTANEOUS
  Filled 2011-12-27 (×13): qty 1

## 2011-12-27 MED ORDER — CLOPIDOGREL BISULFATE 75 MG PO TABS
75.0000 mg | ORAL_TABLET | Freq: Every day | ORAL | Status: DC
  Administered 2011-12-28 – 2011-12-31 (×4): 75 mg via ORAL
  Filled 2011-12-27 (×6): qty 1

## 2011-12-27 MED ORDER — MIDAZOLAM HCL 2 MG/2ML IJ SOLN
INTRAMUSCULAR | Status: AC
  Filled 2011-12-27: qty 2

## 2011-12-27 MED ORDER — HEPARIN (PORCINE) IN NACL 2-0.9 UNIT/ML-% IJ SOLN
INTRAMUSCULAR | Status: AC
  Filled 2011-12-27: qty 2000

## 2011-12-27 MED ORDER — FENTANYL CITRATE 0.05 MG/ML IJ SOLN
INTRAMUSCULAR | Status: AC
Start: 2011-12-27 — End: 2011-12-27
  Filled 2011-12-27: qty 2

## 2011-12-27 MED ORDER — CARVEDILOL 3.125 MG PO TABS
3.1250 mg | ORAL_TABLET | Freq: Two times a day (BID) | ORAL | Status: DC
  Administered 2011-12-27 – 2011-12-28 (×3): 3.125 mg via ORAL
  Filled 2011-12-27 (×7): qty 1

## 2011-12-27 MED ORDER — POTASSIUM CHLORIDE CRYS ER 20 MEQ PO TBCR
40.0000 meq | EXTENDED_RELEASE_TABLET | Freq: Every day | ORAL | Status: DC
  Administered 2011-12-27 – 2011-12-31 (×6): 40 meq via ORAL
  Filled 2011-12-27 (×3): qty 2
  Filled 2011-12-27: qty 1
  Filled 2011-12-27 (×2): qty 2

## 2011-12-27 MED ORDER — HEPARIN SOD (PORCINE) IN D5W 100 UNIT/ML IV SOLN
2100.0000 [IU]/h | INTRAVENOUS | Status: DC
  Filled 2011-12-27: qty 250

## 2011-12-27 MED ORDER — LIVING WELL WITH DIABETES BOOK
Freq: Once | Status: DC
  Filled 2011-12-27: qty 1

## 2011-12-27 MED ORDER — BIVALIRUDIN 250 MG IV SOLR
INTRAVENOUS | Status: AC
  Filled 2011-12-27: qty 250

## 2011-12-27 MED ORDER — FUROSEMIDE 10 MG/ML IJ SOLN
40.0000 mg | Freq: Two times a day (BID) | INTRAMUSCULAR | Status: DC
  Administered 2011-12-27 – 2011-12-28 (×4): 40 mg via INTRAVENOUS
  Filled 2011-12-27 (×7): qty 4

## 2011-12-27 MED ORDER — LIDOCAINE HCL (PF) 1 % IJ SOLN
INTRAMUSCULAR | Status: AC
  Filled 2011-12-27: qty 30

## 2011-12-27 NOTE — Progress Notes (Signed)
Patient Name: [name]      SUBJECTIVE:ushortness of breath that prompted the need for IV lasix  Has had copious po intake  Past Medical History  Diagnosis Date  . 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 Hypertension     9 yrs ago, stopped smoking and B/P went down  . Arthritis     PHYSICAL EXAM Filed Vitals:   12/27/11 0400 12/27/11 0500 12/27/11 0600 12/27/11 0700  BP: 108/60 112/63 116/65 127/70  Pulse: 86 85 85 93  Temp: 98.2 F (36.8 C)     TempSrc: Oral     Resp:      Height:      Weight:  237 lb 14 oz (107.9 kg)    SpO2: 96% 95% 96% 96%    General appearance: alert, appears stated age, no distress and moderately obese Neck: no carotid bruit Lungs: clear to auscultation bilaterally Heart: regular rate and rhythm, S1, S2 normal, no murmur, click, rub or gallop Abdomen: soft, non-tender; bowel sounds normal; no masses,  no organomegaly Extremities: edema trace  Pulses: 2+ and symmetric Skin: Skin color, texture, turgor normal. No rashes or lesions Neurologic: Alert and oriented X 3, normal strength and tone. Normal symmetric reflexes. Normal coordination and gait   TELEMETRY: Reviewed telemetry pt in NSR:    Intake/Output Summary (Last 24 hours) at 12/27/11 0751 Last data filed at 12/27/11 0700  Gross per 24 hour  Intake 2988.85 ml  Output   1600 ml  Net 1388.85 ml    LABS: Basic Metabolic Panel:  Lab 12/26/11 4782 12/25/11 0731  NA 136 129*  K 3.5 4.5  CL 96 92*  CO2 27 24  GLUCOSE 270* 295*  BUN 12 10  CREATININE 0.58 0.49*  CALCIUM 9.2 9.5  MG -- --  PHOS -- --   Cardiac Enzymes:  Basename 12/25/11 2330 12/25/11 1633 12/25/11 0904  CKTOTAL 120 130 159  CKMB 4.4* 6.1* 4.9*  CKMBINDEX -- -- --  TROPONINI 7.65* 7.56* 3.58*   CBC:  Lab 12/27/11 0535 12/26/11 0547 12/25/11 0731  WBC 8.7 9.6 10.2  NEUTROABS -- -- 8.2*  HGB 13.0 13.5 14.9  HCT 35.5* 37.3* 40.7  MCV 89.0 90.5 89.8  PLT 193 199 211     Basename 12/25/11 1540    HGBA1C 9.6*   Fasting Lipid Panel:  Basename 12/25/11 1539  CHOL 203*  HDL 44  LDLCALC 129*  TRIG 152*  CHOLHDL 4.6  LDLDIRECT --   Thyroid Function Tests:  Basename 12/25/11 1147  TSH 1.528  T4TOTAL --  T3FREE --  THYROIDAB --     ASSESSMENT AND PLAN:  Acute systolic heart failure with Tn>7 in setting of 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  Did have antecedent viral syndrome  For now,  1) anticipate cath R and L today 2) diabetes consult 3) betablockers Change to coreg 4) continue arb may be preferentially beneficial in myocarditis 5) continue Lasix; given CHF throughthe night will decrease precath fluids to KVO--renal function is normal Cr <.6) and begin aldactone post procedure 6)hep/NTG 7)  out pt sleep study 8) failed to order BMET for today,, has been done but pending With K 3.5 yesterday have repleted empirically this am   Patient Active Hospital Problem List: Acute systolic congestive heart failure (  12/26/2011)     Cardiomyopathy  LVH ? hypertensive (12/26/2011)   NSTEMI (non-ST elevated myocardial infarction) (12/26/2011)   Obesity (12/26/2011)   Type II or unspecified type diabetes mellitus without mention of complication, not stated as uncontrolled (12/26/2011)      Signed, Sherryl Manges MD  12/27/2011

## 2011-12-27 NOTE — Op Note (Signed)
Cardiac Catheterization Procedure Note  Name: [name] MRN: [id number] DOB: 07/24/1956  Procedure: Right Heart Cath, Left Heart Cath, Selective Coronary Angiography, LV angiography  Indication: CHF   Procedural Details: The right groin was prepped, draped, and anesthetized with 1% lidocaine. Using the modified Seldinger technique a 5 French sheath was placed in the right femoral artery and a 7 French sheath was placed in the right femoral vein. A Swan-Ganz catheter was used for the right heart catheterization. Standard protocol was followed for recording of right heart pressures and sampling of oxygen saturations. Fick cardiac output was calculated. Standard Judkins catheters were used for selective coronary angiography and left ventriculography. There were no immediate procedural complications. The patient was transferred to the post catheterization recovery area for further monitoring.  Procedural Findings: Hemodynamics RA 11 RV 32 9 PA 30 20 PCWP 22 LV 129/7/24 AO 129/83   Oxygen saturations: PA 61% AO 91%  Cardiac Output (Fick) 4.68  Cardiac Index (Fick) 2.09   Coronary angiography: Coronary dominance: right  Left mainstem: Normal  Left anterior descending (LAD): normal  IM: normal  D1: normal  D2: normal  Left circumflex (LCx): normal  OM1: normal  OM2: normal   Right coronary artery (RCA): 95% eccentric lesion proximally.  30-40% distal  EF:  By echo 20-25% diffuse hypokinesis  Final Conclusions:  Severe single vessel disease with concomitant DCM Recommendations: BMS to RCA   [name] 12/27/2011, 1:41 PM

## 2011-12-27 NOTE — Progress Notes (Deleted)
Inpatient Diabetes Program Recommendations  AACE/ADA: New Consensus Statement on Inpatient Glycemic Control (2009)  Target Ranges:  Prepandial:   less than 140 mg/dL      Peak postprandial:   less than 180 mg/dL (1-2 hours)      Critically ill patients:  140 - 180 mg/dL   Reason: Hyperglycemia-fasting CBG=228 noon=369  Inpatient Diabetes Program Recommendations Insulin - Basal: Lantus 10 units  May also need meal coverage for elevated post-prandials.

## 2011-12-27 NOTE — Progress Notes (Signed)
Inpatient Diabetes Program Recommendations  AACE/ADA: New Consensus Statement on Inpatient Glycemic Control (2009)  Target Ranges:  Prepandial:   less than 140 mg/dL      Peak postprandial:   less than 180 mg/dL (1-2 hours)      Critically ill patients:  140 - 180 mg/dL   Reason: Hyperglycemia CBGs [phone]  Inpatient Diabetes Program Recommendations Insulin - Basal: Start Lantus 20 units and titrate up daily for fasting <140 Outpatient Referral: [facility]  Note: Attempted to see patient and discuss new diagnosis of DM.  Patient was gone to the cath lab.  Inpatient education orders entered into EPIC for bedside RN to implement.  Patient will need a RX for a glucose meter, strips and lancets at discharge.  For additional assistance please page [phone] 8am-10pm Thank you! Piedad Climes RN,BSN,CDE  Inpatient diabetes coordinator

## 2011-12-27 NOTE — H&P (View-Only) (Signed)
Patient Name: [name]      SUBJECTIVE:ushortness of breath that prompted the need for IV lasix  Has had copious po intake  Past Medical History  Diagnosis Date  . 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 Hypertension     9 yrs ago, stopped smoking and B/P went down  . Arthritis     PHYSICAL EXAM Filed Vitals:   12/27/11 0400 12/27/11 0500 12/27/11 0600 12/27/11 0700  BP: 108/60 112/63 116/65 127/70  Pulse: 86 85 85 93  Temp: 98.2 F (36.8 C)     TempSrc: Oral     Resp:      Height:      Weight:  237 lb 14 oz (107.9 kg)    SpO2: 96% 95% 96% 96%    General appearance: alert, appears stated age, no distress and moderately obese Neck: no carotid bruit Lungs: clear to auscultation bilaterally Heart: regular rate and rhythm, S1, S2 normal, no murmur, click, rub or gallop Abdomen: soft, non-tender; bowel sounds normal; no masses,  no organomegaly Extremities: edema trace  Pulses: 2+ and symmetric Skin: Skin color, texture, turgor normal. No rashes or lesions Neurologic: Alert and oriented X 3, normal strength and tone. Normal symmetric reflexes. Normal coordination and gait   TELEMETRY: Reviewed telemetry pt in NSR:    Intake/Output Summary (Last 24 hours) at 12/27/11 0751 Last data filed at 12/27/11 0700  Gross per 24 hour  Intake 2988.85 ml  Output   1600 ml  Net 1388.85 ml    LABS: Basic Metabolic Panel:  Lab 12/26/11 0547 12/25/11 0731  NA 136 129*  K 3.5 4.5  CL 96 92*  CO2 27 24  GLUCOSE 270* 295*  BUN 12 10  CREATININE 0.58 0.49*  CALCIUM 9.2 9.5  MG -- --  PHOS -- --   Cardiac Enzymes:  Basename 12/25/11 2330 12/25/11 1633 12/25/11 0904  CKTOTAL 120 130 159  CKMB 4.4* 6.1* 4.9*  CKMBINDEX -- -- --  TROPONINI 7.65* 7.56* 3.58*   CBC:  Lab 12/27/11 0535 12/26/11 0547 12/25/11 0731  WBC 8.7 9.6 10.2  NEUTROABS -- -- 8.2*  HGB 13.0 13.5 14.9  HCT 35.5* 37.3* 40.7  MCV 89.0 90.5 89.8  PLT 193 199 211     Basename 12/25/11 1540    HGBA1C 9.6*   Fasting Lipid Panel:  Basename 12/25/11 1539  CHOL 203*  HDL 44  LDLCALC 129*  TRIG 152*  CHOLHDL 4.6  LDLDIRECT --   Thyroid Function Tests:  Basename 12/25/11 1147  TSH 1.528  T4TOTAL --  T3FREE --  THYROIDAB --     ASSESSMENT AND PLAN:  Acute systolic heart failure with Tn>7 in setting of likely hypertensive cardiomyopathy with LVH.. The two questions 1) cause of troponin 2) cause of LV dysfunction Possibilities incl chronic LVH+/- LV dysfuction with ischemic event or myocardtiis event triggering decompensation, acute myocarditis with chronic LVH.  Did have antecedent viral syndrome  For now,  1) anticipate cath R and L today 2) diabetes consult 3) betablockers Change to coreg 4) continue arb may be preferentially beneficial in myocarditis 5) continue Lasix; given CHF throughthe night will decrease precath fluids to KVO--renal function is normal Cr <.6) and begin aldactone post procedure 6)hep/NTG 7)  out pt sleep study 8) failed to order BMET for today,, has been done but pending With K 3.5 yesterday have repleted empirically this am   Patient Active Hospital Problem List: Acute systolic congestive heart failure (  12/26/2011)     Cardiomyopathy  LVH ? hypertensive (12/26/2011)   NSTEMI (non-ST elevated myocardial infarction) (12/26/2011)   Obesity (12/26/2011)   Type II or unspecified type diabetes mellitus without mention of complication, not stated as uncontrolled (12/26/2011)      Signed, Steven Klein MD  12/27/2011   

## 2011-12-27 NOTE — Progress Notes (Signed)
1:10 AM   Heparin level 0.26   Goal 0.3-0.7 for ACS  Current rate 1800/hr   No bleeding reported  Increase to 2000 units/hr and f/u with repeat 6 hour HL  (moving am lab to this time)   Janice Coffin

## 2011-12-27 NOTE — Interval H&P Note (Signed)
History and Physical Interval Note:  12/27/2011 1:09 PM  [name]  has presented today for surgery, with the diagnosis of chest pain  The various methods of treatment have been discussed with the patient and family. After consideration of risks, benefits and other options for treatment, the patient has consented to  Procedure(s): LEFT AND RIGHT HEART CATHETERIZATION WITH CORONARY ANGIOGRAM as a surgical intervention .  The patients' history has been reviewed, patient examined, no change in status, stable for surgery.  I have reviewed the patients' chart and labs.  Questions were answered to the patient's satisfaction.     [name]  12/27/2011

## 2011-12-27 NOTE — Brief Op Note (Signed)
See operative note Severe single vessel RCA Disease.  Dr [place] to proceed woth BMS  [name] 1:41 PM 12/27/2011

## 2011-12-27 NOTE — Op Note (Signed)
CARDIAC CATH NOTE  Name: [name] MRN: [id number] DOB: 1956/07/02  Procedure: PTCA and stenting of the proximal right coronary  Indication: 56 year old white male with dilated cardiomyopathy and ejection fraction of 20%. Diagnostic cardiac catheterization demonstrated a 95% stenosis in the proximal right coronary. This is a focal lesion. There was somewhat diffuse plaque in the mid right coronary that was nonobstructive.  Procedural Details: The diagnostic femoral sheath was exchanged for a 6 Fr sheath .  Weight-based bivalirudin was given for anticoagulation. Plavix 600 mg by mouth was given. Once a therapeutic ACT was achieved, a 6 [place] JR 4 guide catheter was inserted.  A pro-water coronary guidewire was used to cross the lesion.  The lesion was predilated with a 2.5 mm balloon.  The lesion was then stented with a 3.5 x 15 mm Integrity stent.  The stent was postdilated with a 3.75 mm noncompliant balloon.  Following PCI, there was 0% residual stenosis and TIMI-3 flow. Final angiography confirmed an excellent result. The patient tolerated the procedure well. There were no immediate procedural complications. Femoral hemostasis was achieved with manual compression. The patient was transferred to the post catheterization recovery area for further monitoring.  Lesion Data: Vessel: Proximal right coronary Percent stenosis (pre): 95% TIMI-flow (pre):  3 Stent:  3.5 x 15 mm integrity Percent stenosis (post): 0% TIMI-flow (post): 3  Conclusions: Successful stenting of the proximal right coronary with a bare-metal stent.  Recommendations: Continue dual antiplatelet therapy for at least one month.  Peter [place] 12/27/2011, 2:29 PM

## 2011-12-27 NOTE — Progress Notes (Signed)
ANTICOAGULATION CONSULT NOTE - Follow Up Consult  Pharmacy Consult for: Heparin Indication: chest pain/ACS  No Known Allergies  Vital Signs: Temp: 98.7 F (37.1 C) (01/04 0800) Temp src: Oral (01/04 0800) BP: 133/59 mmHg (01/04 0900) Pulse Rate: 93  (01/04 0700)  Labs:  Basename 12/27/11 1610 12/27/11 0535 12/26/11 2349 12/26/11 1624 12/26/11 0547 12/25/11 2330 12/25/11 1633 12/25/11 0904 12/25/11 0731  HGB -- 13.0 -- -- 13.5 -- -- -- --  HCT -- 35.5* -- -- 37.3* -- -- -- 40.7  PLT -- 193 -- -- 199 -- -- -- 211  APTT -- -- -- -- -- -- -- -- --  LABPROT -- 14.4 -- -- -- -- -- -- --  INR -- 1.10 -- -- -- -- -- -- --  HEPARINUNFRC 0.29* -- 0.26* <0.10* -- -- -- -- --  CREATININE 0.50 -- -- -- 0.58 -- -- -- 0.49*  CKTOTAL -- -- -- -- -- 120 130 159 --  CKMB -- -- -- -- -- 4.4* 6.1* 4.9* --  TROPONINI -- -- -- -- -- 7.65* 7.56* 3.58* --   Estimated Creatinine Clearance: 126.9 ml/min (by C-G formula based on Cr of 0.5).   Medications:  Heparin @ 2000 units/hr  Assessment: 55yom continues on heparin for CP with elevated troponins. Heparin level of 0.29 is just slightly below goal. No bleeding. Plan for CATH today.  Goal of Therapy:  Heparin level 0.3-0.7 units/ml   Plan:  1) Increase heparin to 2100 units/hr 2) Follow up after CATH  Fredrik Rigger 12/27/2011,11:26 AM

## 2011-12-28 ENCOUNTER — Other Ambulatory Visit: Payer: Self-pay

## 2011-12-28 DIAGNOSIS — I471 Supraventricular tachycardia: Secondary | ICD-10-CM

## 2011-12-28 DIAGNOSIS — I5021 Acute systolic (congestive) heart failure: Secondary | ICD-10-CM

## 2011-12-28 DIAGNOSIS — I214 Non-ST elevation (NSTEMI) myocardial infarction: Principal | ICD-10-CM

## 2011-12-28 DIAGNOSIS — I2510 Atherosclerotic heart disease of native coronary artery without angina pectoris: Secondary | ICD-10-CM

## 2011-12-28 LAB — BASIC METABOLIC PANEL
Chloride: 96 mEq/L (ref 96–112)
GFR calc Af Amer: >90 mL/min (ref >90)
GFR calc non Af Amer: >90 mL/min (ref >90)
Potassium: 3.3 mEq/L — ABNORMAL LOW (ref 3.5–5.1)
Sodium: 133 mEq/L — ABNORMAL LOW (ref 135–145)

## 2011-12-28 LAB — CBC
HCT: 37.1 % — ABNORMAL LOW (ref 39.0–52.0)
Hemoglobin: 13.3 g/dL (ref 13.0–17.0)
WBC: 7.5 10*3/uL (ref 4.0–10.5)

## 2011-12-28 LAB — GLUCOSE, CAPILLARY
Glucose-Capillary: 319 mg/dL — ABNORMAL HIGH (ref 70–99)
Glucose-Capillary: 172 mg/dL — ABNORMAL HIGH (ref 70–99)

## 2011-12-28 NOTE — Progress Notes (Signed)
CARDIAC REHAB PHASE I   PRE:  Rate/Rhythm: 102 ST  BP:  Supine:   Sitting: 141/87  Standing:    SaO2:   MODE:  Ambulation: 680 ft   POST:  Rate/Rhythem: 113 ST  BP:  Supine:   Sitting: 153/88  Standing:    SaO2:  0830-0945 Tolerated ambulation well without c/o of cp or SOB. Gait steady, VS stable. Started MI and CHF education with pt. He agrees to Outpt CRP in [place], well send referral.  Beatrix Fetters

## 2011-12-28 NOTE — Progress Notes (Signed)
Pt. Transferred to rm. 2003.  Report called and updates given.

## 2011-12-28 NOTE — Plan of Care (Signed)
Problem: Food- and Nutrition-Related Knowledge Deficit (NB-1.1) Goal: Nutrition education Formal process to instruct or train a patient/client in a skill or to impart knowledge to help patients/clients voluntarily manage or modify food choices and eating behavior to maintain or improve health.  Outcome: Completed/Met Date Met:  12/28/11 RD consult for DM diet education. Reviewed diet guidelines and recommendations. Handouts provided from [employer]. Pt consuming 100% on a Heart Healthy diet. BMI = 33.8 kg/m2 (Obesity Class I). No further nutrition intervention indicated at this time.  Alger Memos

## 2011-12-28 NOTE — Progress Notes (Signed)
   Subjective: Sitting in chair. No chest pain. Breathing much better. No groin pain.   Objective: Temp:  [98 F (36.7 C)-98.8 F (37.1 C)] 98.6 F (37 C) (01/05 0410) Pulse Rate:  [102-107] 102  (01/05 0038) Resp:  [10-19] 15  (01/05 0038) BP: (113-145)/(54-85) 113/71 mmHg (01/05 0410) SpO2:  [93 %-97 %] 97 % (01/05 0410) Weight:  [235 lb 10.8 oz (106.9 kg)] 235 lb 10.8 oz (106.9 kg) (01/05 0600)  I/O last 3 completed shifts: In: 2625.5 [P.O.:1340; I.V.:1271.5; IV Piggyback:14] Out: 3001 [Urine:3000; Stool:1]  Telemetry - Sinus rhythm. Also episodes of PSVT, possibly an ectopic atrial tachycardia or an atypical flutter.  Exam -   General - NAD  Lungs - Clear to auscultation, nonlabored.  Cardiac - Regular rate and rhythm, no S3.  Extremities - Right groin site stable, no hematoma or bruit.  Testing -   Lab Results  Component Value Date   WBC 7.5 12/28/2011   HGB 13.3 12/28/2011   HCT 37.1* 12/28/2011   MCV 89.6 12/28/2011   PLT 197 12/28/2011    Lab Results  Component Value Date   CREATININE 0.52 12/28/2011   BUN 9 12/28/2011   NA 133* 12/28/2011   K 3.3* 12/28/2011   CL 96 12/28/2011   CO2 27 12/28/2011    Lab Results  Component Value Date   CKTOTAL 120 12/25/2011   CKMB 4.4* 12/25/2011   TROPONINI 7.65* 12/25/2011    Current Medications    . aspirin EC  81 mg Oral Daily  . bivalirudin      . carvedilol  3.125 mg Oral BID WC  . clopidogrel  75 mg Oral Q breakfast  . fentaNYL      . furosemide  40 mg Intravenous BID  . heparin      . heparin  5,000 Units Subcutaneous Q8H  . insulin aspart  0-15 Units Subcutaneous TID WC  . insulin aspart  0-5 Units Subcutaneous QHS  . lidocaine      . living well with diabetes book   Does not apply Once  . midazolam      . midazolam      . nitroGLYCERIN      . olmesartan  10 mg Oral Daily  . potassium chloride  40 mEq Oral Daily  . rosuvastatin  40 mg Oral q1800  . DISCONTD: metoprolol tartrate  12.5 mg Oral BID  . DISCONTD:  sodium chloride  3 mL Intravenous Q12H    Assessment:  1. CAD status post BMS to RCA on 1/4.  2. NSTEMI, troponin I 7.65.  3. PSVT noted on monitor. Could consider possibility of tachycardia mediated cardiomyopathy.  3. Acute systolic heart failure, LVEF 20-25% with diffuse hypokinesis. Right heart cath with PCWP of only 22, PA of 30.  4. 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  5. New diagnosis type 2 diabetes mellitus, seen by coordinator. Lantus 20 units daily is recommended.  6. LDL 129.  Plan:  Transfer to telemetry. Will increase Coreg to 6.25 mg twice daily, replete potassium, decrease Lasix to oral dose, add Aldactone. Start Lantus as recommended. Will ask Dr. Graciela Husbands to review telemetry, episodes of apparent PSVT. Followup CBC and BMET AM.  Jonelle Sidle, M.D., F.A.C.C.

## 2011-12-29 ENCOUNTER — Other Ambulatory Visit: Payer: Self-pay

## 2011-12-29 LAB — GLUCOSE, CAPILLARY
Glucose-Capillary: 215 mg/dL — ABNORMAL HIGH (ref 70–99)
Glucose-Capillary: 330 mg/dL — ABNORMAL HIGH (ref 70–99)
Glucose-Capillary: 262 mg/dL — ABNORMAL HIGH (ref 70–99)

## 2011-12-29 LAB — BASIC METABOLIC PANEL
BUN: 12 mg/dL (ref 6–23)
CO2: 26 mEq/L (ref 19–32)
Chloride: 95 mEq/L — ABNORMAL LOW (ref 96–112)
Creatinine, Ser: 0.56 mg/dL (ref 0.50–1.35)
GFR calc Af Amer: >90 mL/min (ref >90)
Glucose, Bld: 229 mg/dL — ABNORMAL HIGH (ref 70–99)
Potassium: 3.5 mEq/L (ref 3.5–5.1)

## 2011-12-29 MED ORDER — CARVEDILOL 6.25 MG PO TABS
6.2500 mg | ORAL_TABLET | Freq: Two times a day (BID) | ORAL | Status: DC
  Administered 2011-12-29: 6.25 mg via ORAL
  Filled 2011-12-29 (×3): qty 1

## 2011-12-29 MED ORDER — CARVEDILOL 12.5 MG PO TABS
12.5000 mg | ORAL_TABLET | Freq: Two times a day (BID) | ORAL | Status: DC
  Administered 2011-12-29 – 2011-12-30 (×2): 12.5 mg via ORAL
  Filled 2011-12-29 (×4): qty 1

## 2011-12-29 MED ORDER — SPIRONOLACTONE 12.5 MG HALF TABLET
12.5000 mg | ORAL_TABLET | Freq: Every day | ORAL | Status: DC
  Administered 2011-12-29 – 2011-12-31 (×3): 12.5 mg via ORAL
  Filled 2011-12-29 (×3): qty 1

## 2011-12-29 MED ORDER — INSULIN GLARGINE 100 UNIT/ML ~~LOC~~ SOLN
20.0000 [IU] | Freq: Every day | SUBCUTANEOUS | Status: DC
  Administered 2011-12-29 – 2011-12-31 (×3): 20 [IU] via SUBCUTANEOUS
  Filled 2011-12-29: qty 3

## 2011-12-29 MED ORDER — POTASSIUM CHLORIDE CRYS ER 20 MEQ PO TBCR
40.0000 meq | EXTENDED_RELEASE_TABLET | Freq: Once | ORAL | Status: AC
  Administered 2011-12-29: 40 meq via ORAL

## 2011-12-29 MED ORDER — FUROSEMIDE 40 MG PO TABS
40.0000 mg | ORAL_TABLET | Freq: Every day | ORAL | Status: DC
  Administered 2011-12-29 – 2011-12-31 (×3): 40 mg via ORAL
  Filled 2011-12-29 (×3): qty 1

## 2011-12-29 NOTE — Progress Notes (Signed)
Subjective: No chest pain. Has ambulated. Only mildly short of breath. No palpitations.    Objective: Temp:  [97.5 F (36.4 C)-98.7 F (37.1 C)] 98.3 F (36.8 C) (01/06 0704) Pulse Rate:  [95-106] 97  (01/06 0704) Resp:  [16-20] 18  (01/06 0704) BP: (121-153)/(73-94) 121/73 mmHg (01/06 0704) SpO2:  [94 %-99 %] 94 % (01/06 0704)  I/O last 3 completed shifts: In: 1871.6 [P.O.:960; I.V.:903.6; IV Piggyback:8] Out: 1601 [Urine:1600; Stool:1]  Telemetry - Sinus rhythm. Sinus tachycardia, no clear PSVT.  Exam -   General - NAD  Lungs - Clear to auscultation, nonlabored.  Cardiac - Regular rate and rhythm, no S3.  Extremities - Right groin site stable, no hematoma or bruit.  Testing -   Lab Results  Component Value Date   WBC 7.5 12/28/2011   HGB 13.3 12/28/2011   HCT 37.1* 12/28/2011   MCV 89.6 12/28/2011   PLT 197 12/28/2011    Lab Results  Component Value Date   CREATININE 0.56 12/29/2011   BUN 12 12/29/2011   NA 132* 12/29/2011   K 3.5 12/29/2011   CL 95* 12/29/2011   CO2 26 12/29/2011    Lab Results  Component Value Date   CKTOTAL 120 12/25/2011   CKMB 4.4* 12/25/2011   TROPONINI 7.65* 12/25/2011    Current Medications    . aspirin EC  81 mg Oral Daily  . carvedilol  6.25 mg Oral BID WC  . clopidogrel  75 mg Oral Q breakfast  . furosemide  40 mg Oral Daily  . heparin  5,000 Units Subcutaneous Q8H  . insulin aspart  0-15 Units Subcutaneous TID WC  . insulin aspart  0-5 Units Subcutaneous QHS  . insulin glargine  20 Units Subcutaneous Daily  . living well with diabetes book   Does not apply Once  . olmesartan  10 mg Oral Daily  . potassium chloride  40 mEq Oral Daily  . potassium chloride  40 mEq Oral Once  . rosuvastatin  40 mg Oral q1800  . spironolactone  12.5 mg Oral Daily  . DISCONTD: carvedilol  3.125 mg Oral BID WC  . DISCONTD: furosemide  40 mg Intravenous BID    ECG - Sinus rhythm with nonspecific ST-T changes.  Assessment:  1. CAD status post BMS to RCA on  1/4.  2. NSTEMI, troponin I 7.65.  3. PSVT noted on monitor. Could consider possibility of tachycardia mediated cardiomyopathy, although has not been persistent.   3. Acute systolic heart failure, LVEF 20-25% with diffuse hypokinesis. Right heart cath with PCWP of only 22, PA of 30.  4. 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  5. New diagnosis type 2 diabetes mellitus, seen by coordinator. Lantus 20 units daily is recommended.  6. LDL 129.  Plan:  Present medications reviewed. Increase Coreg to 12.5 mg twice daily. Glucose trend is improving. Continued ambulation. Followup CBC and BMET tomorrow. Would ask Dr. Graciela Husbands to review telemetry, particularly while in the step down unit the patient had PSVT - will see how further increase in beta blocker effects this. Possibly home in the next 24 hours with close office followup.  Jonelle Sidle, M.D., F.A.C.C.

## 2011-12-30 ENCOUNTER — Encounter (HOSPITAL_COMMUNITY): Payer: Self-pay | Admitting: Physician Assistant

## 2011-12-30 DIAGNOSIS — I509 Heart failure, unspecified: Secondary | ICD-10-CM

## 2011-12-30 DIAGNOSIS — I428 Other cardiomyopathies: Secondary | ICD-10-CM

## 2011-12-30 LAB — BASIC METABOLIC PANEL
CO2: 25 mEq/L (ref 19–32)
Chloride: 91 mEq/L — ABNORMAL LOW (ref 96–112)
Creatinine, Ser: 0.61 mg/dL (ref 0.50–1.35)
Glucose, Bld: 326 mg/dL — ABNORMAL HIGH (ref 70–99)
Sodium: 125 mEq/L — ABNORMAL LOW (ref 135–145)

## 2011-12-30 LAB — CBC
MCH: 32.2 pg (ref 26.0–34.0)
MCHC: 36.4 g/dL — ABNORMAL HIGH (ref 30.0–36.0)
MCV: 88.5 fL (ref 78.0–100.0)
Platelets: 210 10*3/uL (ref 150–400)

## 2011-12-30 LAB — GLUCOSE, CAPILLARY
Glucose-Capillary: 268 mg/dL — ABNORMAL HIGH (ref 70–99)
Glucose-Capillary: 289 mg/dL — ABNORMAL HIGH (ref 70–99)

## 2011-12-30 MED ORDER — CARVEDILOL 6.25 MG PO TABS
18.7500 mg | ORAL_TABLET | Freq: Two times a day (BID) | ORAL | Status: DC
  Administered 2011-12-30 – 2011-12-31 (×2): 18.75 mg via ORAL
  Filled 2011-12-30 (×4): qty 1

## 2011-12-30 MED ORDER — LISINOPRIL 10 MG PO TABS
10.0000 mg | ORAL_TABLET | Freq: Every day | ORAL | Status: DC
  Administered 2011-12-31: 10 mg via ORAL
  Filled 2011-12-30: qty 1

## 2011-12-30 MED ORDER — INSULIN PEN STARTER KIT
1.0000 | Freq: Once | Status: DC
  Filled 2011-12-30: qty 1

## 2011-12-30 MED FILL — Dextrose Inj 5%: INTRAVENOUS | Qty: 50 | Status: AC

## 2011-12-30 NOTE — Progress Notes (Addendum)
Inpatient Diabetes Program Recommendations  AACE/ADA: New Consensus Statement on Inpatient Glycemic Control (2009)  Target Ranges:  Prepandial:   less than 140 mg/dL      Peak postprandial:   less than 180 mg/dL (1-2 hours)      Critically ill patients:  140 - 180 mg/dL   Reason: consult for PO recs.  Inpatient Diabetes Program Recommendations Insulin - Basal: . Oral Agents: If no contraindications, would recommend starting Metformin 500mg  daily to start. If tolerates can increase dose.  Also, Amaryl 2mg  daily to start. Outpatient Referral: [facility]     Spoke with patient concerning new diagnosis.  Patient is ready to learn and ready make lifestyle modifications.   Will continue to follow during this admission. Thank you

## 2011-12-30 NOTE — Progress Notes (Addendum)
Patient Name: [name]      SUBJECTIVE: Admitted last week with SOB and tachycardia; CT r/o PE but  May have precipitated his eventual pulmonary edema Had NSTEMI and underwent cath showing 1VCAD with RCA BMS  Global hypokinesis with EF 20-25%  Feels better and ready to go home   Past Medical History  Diagnosis Date  . 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 Hypertension     9 yrs ago, stopped smoking and B/P went down  . Arthritis     PHYSICAL EXAM Filed Vitals:   12/29/11 0704 12/29/11 1300 12/29/11 2130 12/30/11 0600  BP: 121/73 142/95 126/82 133/84  Pulse: 97 95 95 98  Temp: 98.3 F (36.8 C) 97.1 F (36.2 C) 97.5 F (36.4 C) 97 F (36.1 C)  TempSrc: Oral Oral Oral Oral  Resp: 18 18 18 18   Height:      Weight:    227 lb 11.8 oz (103.3 kg)  SpO2: 94% 98% 95% 97%    Well developed and nourished in no acute distress HENT normal Neck supple with JVP-flat Carotids brisk and full without bruits Clear Regular rate and rhythm, no murmurs or gallops Abd-soft with active BS without hepatomegaly No Clubbing cyanosis edema Skin-warm and dry A & Oriented  Grossly normal sensory and motor function  Tel sinus rhytm  Intake/Output Summary (Last 24 hours) at 12/30/11 0837 Last data filed at 12/30/11 0700  Gross per 24 hour  Intake    720 ml  Output   1500 ml  Net   -780 ml    LABS: Basic Metabolic Panel:  Lab 12/30/11 6295 12/29/11 0524 12/28/11 0500 12/27/11 1825 12/27/11 0917 12/26/11 0547 12/25/11 0731  NA 125* 132* 133* -- 132* 136 129*  K 3.8 3.5 3.3* -- 3.3* 3.5 4.5  CL 91* 95* 96 -- 93* 96 92*  CO2 25 26 27  -- 29 27 24   GLUCOSE 326* 229* 233* -- 247* 270* 295*  BUN 12 12 9  -- 10 12 10   CREATININE 0.61 0.56 0.52 0.52 0.50 0.58 0.49*  CALCIUM 9.1 9.4 -- -- -- -- --  MG -- -- -- -- -- -- --  PHOS -- -- -- -- -- -- --   Cardiac Enzymes: No results found for this basename: CKTOTAL:3,CKMB:3,CKMBINDEX:3,TROPONINI:3 in the last 72 hours CBC:  Lab  12/30/11 0003 12/28/11 0500 12/27/11 1825 12/27/11 0535 12/26/11 0547 12/25/11 0731  WBC 8.3 7.5 7.2 8.7 9.6 10.2  NEUTROABS -- -- -- -- -- 8.2*  HGB 12.6* 13.3 12.9* 13.0 13.5 14.9  HCT 34.6* 37.1* 35.5* 35.5* 37.3* 40.7  MCV 88.5 89.6 89.0 89.0 90.5 89.8  PLT 210 197 190 193 199 211   PROTIME: No results found for this basename: LABPROT:3,INR:3 in the last 72 hours Liver Function Tests:     ASSESSMENT AND PLAN:  Patient Active Hospital Problem List: Acute systolic congestive heart failure (12/26/2011)   Assessment: much improved   Plan: continue meds  Secondary cardiomyopathy, unspecified (12/26/2011)   Assessment: stable   Plan: cont guideline directed therapy NSTEMI (non-ST elevated myocardial infarction) (12/26/2011)   Assessment: continue plavix/asa x 1 month   Plan:  Obesity (12/26/2011)   Assessment: needs dietary consult   Plan:  Type II or unspecified type diabetes mellitus without mention of complication, not stated as uncontrolled (12/26/2011)   Assessment:  Needs recommendtation for diet and outpt therapy   Plan:   Relatvie tachycardia-increase coreg Discontinue nitroglyucreine    Signed,  Sherryl Manges MD  12/30/2011

## 2011-12-30 NOTE — Progress Notes (Signed)
CARDIAC REHAB PHASE I   PRE:  Rate/Rhythm: 89 SRT  BP:  Supine:   Sitting: 100/60  Standing:    SaO2: 95 RA  MODE:  Ambulation: 890 ft   POST:  Rate/Rhythem: 96  BP:  Supine:   Sitting: 120/60  Standing:    SaO2: 96 RA 0923-1000  Tolerated ambulation well only c/o was of stiff knees. Completed discharge education with him. He voices understanding.  Beatrix Fetters

## 2011-12-30 NOTE — Progress Notes (Signed)
Nutrition Education Brief Note  RD received consult for DM and CHF education. DM education was completed on 1/5. RD reviewed DM education with patient, provided an additional hand out. Patient states he is familiar with low sodium diet. RD discussed ways to reduce sodium intake. Patient verbalized understanding of both DM and low sodium diet. No additional questions at this time. Chart reviewed, no additional nutrition interventions at this time.   [name] MARIE

## 2011-12-30 NOTE — Consult Note (Signed)
Advanced Heart Failure Consult Note  Referring Physician: Dr. Graciela Husbands Primary Physician: [facility] Primary Cardiologist: Dr. Graciela Husbands Reason for Consultation: Heart Failure Management    HPI:  Mr. Weber is a 56 y.o. gentlemen with history of HTN and prediabetes that presented to [facility] with dyspnea and chest pressure. R/o for PE.  He was found to have a NSTEMI and underwent cardiac catheterization on 1/4.  This demonstrated a 95% stenosis in the proximal RCA otherwise normal vessels, underwent PTCA with a BMS by Dr. [place] on 1/4.  Plavix/ASA for at least 1 month.    1/4: Right cardiac cath at the time showed RA 11, RV 32 9, PA 30 20, PCWP 22, LV 129/7/24, PA 61%, AO 91%, Cardiac Output (Fick) 4.68, Cardiac Index (Fick) 2.09.  Echocardiogram showed diffuse hypokinesis with an EF 20-25%, mild LVH and mildly dilated left atrium.  He was treated for systolic heart failure.    He has been placed on coreg, lasix, benicar, Kdur, and spiro for his systolic heart failure.  He diuresed 9 lbs and feels much improved.  He denies CP, orthopnea/PND or SOB.  Prior to progressive dyspnea and fatigue over the last several weeks he noted no limitations in his activities.  During his hospital stay he has also been found to have diabetes with a HgA1c of 9.6.  Lipids: TC 203, TG 152, HDL 44, LDL 129 he is currently on crestor.    Dr. Graciela Husbands has asked the HF team to assist in management of NICM.    Review of Systems: [y] = yes, [ ]  = no   General: Weight gain Cove.Etienne ]; Weight loss [ ] ; Anorexia [ ] ; Fatigue [ ] ; Fever [ ] ; Chills [ ] ; Weakness [ ]   Cardiac: Chest pain/pressure [ y]; Resting SOB [ ] ; Exertional SOB Cove.Etienne ]; Orthopnea Cove.Etienne ]; Pedal Edema [ ] ; Palpitations [ ] ; Syncope [ ] ; Presyncope [ ] ; Paroxysmal nocturnal dyspnea[y ]  Pulmonary: Cough [ ] ; Wheezing[ ] ; Hemoptysis[ ] ; Sputum [ ] ; Snoring [ ]   GI: Vomiting[ ] ; Dysphagia[ ] ; Melena[ ] ; Hematochezia [ ] ; Heartburn[ ] ; Abdominal pain [ ] ; Constipation [ ] ;  Diarrhea [ ] ; BRBPR [ ]   GU: Hematuria[ ] ; Dysuria [ ] ; Nocturia[ ]   Vascular: Pain in legs with walking [ ] ; Pain in feet with lying flat [ ] ; Non-healing sores [ ] ; Stroke [ ] ; TIA [ ] ; Slurred speech [ ] ;  Neuro: Headaches[ ] ; Vertigo[ ] ; Seizures[ ] ; Paresthesias[ ] ;Blurred vision [ ] ; Diplopia [ ] ; Vision changes [ ]   Ortho/Skin: Arthritis Cove.Etienne ]; Joint pain [ ] ; Muscle pain [ ] ; Joint swelling [ ] ; Back Pain [ ] ; Rash [ ]   Psych: Depression[ ] ; Anxiety[ ]   Heme: Bleeding problems [ ] ; Clotting disorders [ ] ; Anemia [ ]   Endocrine: Diabetes Cove.Etienne ]; Thyroid dysfunction[ ]     Past Medical History  Diagnosis Date  . 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 Hypertension     9 yrs ago, stopped smoking and B/P went down  . Arthritis        . aspirin EC  81 mg Oral Daily  . carvedilol  18.75 mg Oral BID WC  . clopidogrel  75 mg Oral Q breakfast  . furosemide  40 mg Oral Daily  . heparin  5,000 Units Subcutaneous Q8H  . insulin aspart  0-15 Units Subcutaneous TID WC  . insulin aspart  0-5 Units Subcutaneous QHS  . insulin glargine  20  Units Subcutaneous Daily  . living well with diabetes book   Does not apply Once  . olmesartan  10 mg Oral Daily  . potassium chloride  40 mEq Oral Daily  . rosuvastatin  40 mg Oral q1800  . spironolactone  12.5 mg Oral Daily  . DISCONTD: carvedilol  12.5 mg Oral BID WC    Infusions:    No Known Allergies  History   Social History  . Marital Status: Divorced    Spouse Name: Currently has girlfriend    Number of Children: N/A  . Years of Education: N/A   Occupational History  . IT      at [employer]    Social History Main Topics  . Smoking status: Former Smoker -- 1.5 packs/day for 36 years    Types: Cigarettes    Quit date: 10/22/2002  . Smokeless tobacco: Never Used  . Alcohol Use: 14.4 oz/week    24 Cans of beer per week  . Drug Use: No  . Sexually Active: Yes    Birth Control/ Protection: Surgical   Other Topics Concern  . Not on  file   Social History Narrative  . No narrative on file    Family history. His mother is alive in her late 19s without cardiac issues. His father died in his 38s from cirrhosis with no cardiac issues. He has brothers with hypertension but no cardiac issues. He has a grandmother who had a heart attack in her 22s.   PHYSICAL EXAM: Filed Vitals:   12/30/11 0600  BP: 133/84  Pulse: 98  Temp: 97 F (36.1 C)  Resp: 18  Wt: 227 lbs (down from 236 lbs on admit)   Intake/Output Summary (Last 24 hours) at 12/30/11 1250 Last data filed at 12/30/11 0700  Gross per 24 hour  Intake    720 ml  Output      0 ml  Net    720 ml    General:  Well appearing. No respiratory difficulty HEENT: normal Neck: supple. Thick.  no JVD. Carotids 2+ bilat; no bruits. No lymphadenopathy or thryomegaly appreciated. Cor: PMI nondisplaced.  Regular rate & rhythm. No rubs, gallops or murmurs. Lungs: clear Abdomen: obese soft, nontender, nondistended. No hepatosplenomegaly. No bruits or masses. Good bowel sounds. Extremities: no cyanosis, clubbing, rash, edema Neuro: alert & oriented x 3, cranial nerves grossly intact. moves all 4 extremities w/o difficulty. Affect pleasant.  ECG: NSR 92. LVH with repol. QRS  Results for orders placed during the hospital encounter of 12/25/11 (from the past 24 hour(s))  GLUCOSE, CAPILLARY     Status: Abnormal   Collection Time   12/29/11  4:36 PM      Component Value Range   Glucose-Capillary 190 (*) 70 - 99 (mg/dL)   Comment 1 Notify RN    GLUCOSE, CAPILLARY     Status: Abnormal   Collection Time   12/29/11  9:12 PM      Component Value Range   Glucose-Capillary 262 (*) 70 - 99 (mg/dL)   Comment 1 Notify RN    CBC     Status: Abnormal   Collection Time   12/30/11 12:03 AM      Component Value Range   WBC 8.3  4.0 - 10.5 (K/uL)   RBC 3.91 (*) 4.22 - 5.81 (MIL/uL)   Hemoglobin 12.6 (*) 13.0 - 17.0 (g/dL)   HCT 40.1 (*) 02.7 - 52.0 (%)   MCV 88.5  78.0 - 100.0  (fL)  MCH 32.2  26.0 - 34.0 (pg)   MCHC 36.4 (*) 30.0 - 36.0 (g/dL)   RDW 04.5  40.9 - 81.1 (%)   Platelets 210  150 - 400 (K/uL)  BASIC METABOLIC PANEL     Status: Abnormal   Collection Time   12/30/11 12:03 AM      Component Value Range   Sodium 125 (*) 135 - 145 (mEq/L)   Potassium 3.8  3.5 - 5.1 (mEq/L)   Chloride 91 (*) 96 - 112 (mEq/L)   CO2 25  19 - 32 (mEq/L)   Glucose, Bld 326 (*) 70 - 99 (mg/dL)   BUN 12  6 - 23 (mg/dL)   Creatinine, Ser 9.14  0.50 - 1.35 (mg/dL)   Calcium 9.1  8.4 - 78.2 (mg/dL)   GFR calc non Af Amer >90  >90 (mL/min)   GFR calc Af Amer >90  >90 (mL/min)  GLUCOSE, CAPILLARY     Status: Abnormal   Collection Time   12/30/11  6:22 AM      Component Value Range   Glucose-Capillary 226 (*) 70 - 99 (mg/dL)   Comment 1 Notify RN    GLUCOSE, CAPILLARY     Status: Abnormal   Collection Time   12/30/11 11:32 AM      Component Value Range   Glucose-Capillary 268 (*) 70 - 99 (mg/dL)   TSH normal  ASSESSMENT:  1) Acute Systolic HF 2) NICM, EF 20-25% 3) DM, newly diagnosed 4) NSTEMI 5) CAD s/p PCI to RCA with BMS     - on plavix/ASA 6) Dyslipidemia 7) Obesity 8) Tachycardia  9) Emphysema 10) Scattered small lung nodules per CTA this admission  11) Hyponatremia   PLAN/DISCUSSION:  Mr. Ranta presented with new onset systolic heart failure and NSTEMI.  He currently is euvolemic and good HF medications.  Will continue with current therapy and titrate in the outpatient setting.  Patient's tachycardia is probably due to his systolic dysfunction and this should improve with recovery of EF.  Will continue to follow.  Had a long discussion regarding low sodium diet, exercise, and daily weights.  All questions were answered.  The patient is willing to follow up in the HF clinic but it may be easier after initial follow up to see cardiologist in [place].    Ula Lingo 12:54 PM  Patient seen and examined with Ulyess Blossom PA-C. We discussed all  aspects of the encounter. I agree with the assessment and plan as stated above.   Attending:  Cath and echo results reviewed. He likely has NICM due to HTN & DM2 (less likely ETOH). He denies symptoms c/w OSA. He has responded quite well to therapy and is now walking the halls without significant limitation. No s3 or edema. Tachycardia improving. However serum sodium does remains low. Will switch Benicar over to lisinopril due to cost reasons. CHF education provided. Suspect he should be stable for d/c home in the am with close f/u in the HF clinic.    Total MD time spent 50 minutes.  Truman Hayward 7:38 PM

## 2011-12-30 NOTE — Progress Notes (Signed)
   CARE MANAGEMENT NOTE HEART FAILURE  12/30/2011   Patient:  [name], [name]   Account Number:  [id number]    Date Initiated:  12/30/2011  Documentation initiated by:  Shannan Harper  Subjective/Objective Assessment:   Patient admitted with CP, hx of CHF, went into an exacerbation during hospitalization.   Action/Plan:   Will discharge back to home with significant other.  Was independent prior to admission and feels he will progress adequately upon his return to home.   Anticipated DC Date:  12/31/2011  Anticipated DC Plan:  HOME/SELF CARE  DC Planning Services:  CM consult    Choice offered to / List presented to:      [facility] arranged:  HH - 11 Patient Refused      Status of service:  Completed, signed off  Medicare Important Message Given:   (If response is "NO", the following Medicare IM given date fields will be blank) Date Medicare IM Given:   Date Additional Medicare IM Given:    Discharge Disposition:  HOME/SELF CARE  Per UR Regulation:  Reviewed for med. necessity/level of care/duration of stay  Comments:   Met with patient in regards to CHF focus.  He states he has a scale at home and will weigh appropriately.  Discussed salt intake, zones, and physician follow up.  He is comfortable with discharging to home without further assistance at this time.  Discussed the HF education packet.  Will continue to assist as needed. 12/30/11 1625 Shannan Harper, RN, BSN   Initial CM contact:  12/30/2011 12:00 M  By:  Shannan Harper Initial CSW contact:     By:      Is this an INP Readmission < 30 days:  N (If "YES" please see readmission information at the bottom of note)  Patient living status prior to this admission:  FAMILY  Patient setting prior to this admission:  HOME  Comorbid conditions being treated that contributed to this admission:  Low--HTN  CHF Readmission Risk:  low  Type of patient education provided  HF Patient Education Assessment / Teach Back  HF Zone Tool /  Magnet  Limit salt intake  Weigh daily     Patient education provided by  [facility]    Was referral made to [facility]:  N  Is the patient's PCP the same as attending:  N PCP:    Readmission < 30 Days If pt has HH, did they contact the agency before going to the ED:   Name of [facility] agency:    Was the follow-up physician visit scheduled prior to discharge:    Did the patient follow-up with the physician prior to this readmission:    Was there HF Clinic visits prior to readmission:    Were there ED visits between admissions:    Readmit type:    If unscheduled and related indicate reason for readmit:

## 2011-12-31 ENCOUNTER — Encounter (HOSPITAL_COMMUNITY): Payer: Self-pay | Admitting: Physician Assistant

## 2011-12-31 DIAGNOSIS — I5021 Acute systolic (congestive) heart failure: Secondary | ICD-10-CM

## 2011-12-31 LAB — HEPATIC FUNCTION PANEL
ALT: 42 U/L (ref 0–53)
AST: 28 U/L (ref 0–37)
Alkaline Phosphatase: 112 U/L (ref 39–117)
Bilirubin, Direct: 0.2 mg/dL (ref 0.0–0.3)
Total Bilirubin: 1 mg/dL (ref 0.3–1.2)

## 2011-12-31 LAB — PRO B NATRIURETIC PEPTIDE: Pro B Natriuretic peptide (BNP): 738.9 pg/mL — ABNORMAL HIGH (ref 0–125)

## 2011-12-31 LAB — GLUCOSE, CAPILLARY: Glucose-Capillary: 253 mg/dL — ABNORMAL HIGH (ref 70–99)

## 2011-12-31 LAB — BASIC METABOLIC PANEL
BUN: 10 mg/dL (ref 6–23)
Creatinine, Ser: 0.58 mg/dL (ref 0.50–1.35)
GFR calc Af Amer: >90 mL/min (ref >90)
GFR calc non Af Amer: >90 mL/min (ref >90)

## 2011-12-31 MED ORDER — POTASSIUM CHLORIDE CRYS ER 20 MEQ PO TBCR: 20.0000 meq | EXTENDED_RELEASE_TABLET | Freq: Every day | ORAL | Status: DC

## 2011-12-31 MED ORDER — CARVEDILOL 12.5 MG PO TABS: 18.7500 mg | ORAL_TABLET | Freq: Two times a day (BID) | ORAL | Status: DC

## 2011-12-31 MED ORDER — FUROSEMIDE 40 MG PO TABS: 40.0000 mg | ORAL_TABLET | Freq: Every day | ORAL | Status: DC

## 2011-12-31 MED ORDER — CLOPIDOGREL BISULFATE 75 MG PO TABS: 75.0000 mg | ORAL_TABLET | Freq: Every day | ORAL | Status: DC

## 2011-12-31 MED ORDER — GLIMEPIRIDE 2 MG PO TABS: 2.0000 mg | ORAL_TABLET | Freq: Every day | ORAL | Status: DC

## 2011-12-31 MED ORDER — METFORMIN HCL 500 MG PO TABS: 500.0000 mg | ORAL_TABLET | Freq: Every day | ORAL | Status: DC

## 2011-12-31 MED ORDER — NITROGLYCERIN 0.4 MG SL SUBL: 0.4000 mg | SUBLINGUAL_TABLET | SUBLINGUAL | Status: DC | PRN

## 2011-12-31 MED ORDER — LISINOPRIL 10 MG PO TABS: 10.0000 mg | ORAL_TABLET | Freq: Every day | ORAL | Status: DC

## 2011-12-31 MED ORDER — SPIRONOLACTONE 25 MG PO TABS: 25.0000 mg | ORAL_TABLET | Freq: Every day | ORAL | Status: DC

## 2011-12-31 MED ORDER — ATORVASTATIN CALCIUM 80 MG PO TABS: 80.0000 mg | ORAL_TABLET | Freq: Every day | ORAL | Status: DC

## 2011-12-31 MED ORDER — ASPIRIN 81 MG PO TBEC: 81.0000 mg | DELAYED_RELEASE_TABLET | Freq: Every day | ORAL | Status: AC

## 2011-12-31 NOTE — Progress Notes (Signed)
Subjective:    Doing well. Ambulating halls. No CP/SOB/edema. Serum sodium improved.  This am had episode of severe diarrhea and ab cramping with some diaphoresis and mild lightheadedness. Now felling much better. No fever or ab pain.  Weight up slightly from yesterday.     Intake/Output Summary (Last 24 hours) at 12/31/11 0813 Last data filed at 12/30/11 1700  Gross per 24 hour  Intake    600 ml  Output      0 ml  Net    600 ml    Current meds:    . aspirin EC  81 mg Oral Daily  . carvedilol  18.75 mg Oral BID WC  . clopidogrel  75 mg Oral Q breakfast  . Flexpen Starter Kit  1 kit Other Once  . furosemide  40 mg Oral Daily  . heparin  5,000 Units Subcutaneous Q8H  . insulin aspart  0-15 Units Subcutaneous TID WC  . insulin aspart  0-5 Units Subcutaneous QHS  . insulin glargine  20 Units Subcutaneous Daily  . lisinopril  10 mg Oral Daily  . living well with diabetes book   Does not apply Once  . potassium chloride  40 mEq Oral Daily  . rosuvastatin  40 mg Oral q1800  . spironolactone  12.5 mg Oral Daily  . DISCONTD: carvedilol  12.5 mg Oral BID WC  . DISCONTD: olmesartan  10 mg Oral Daily   Infusions:     Objective:  Blood pressure 124/85, pulse 99, temperature 97.4 F (36.3 C), temperature source Oral, resp. rate 18, height 5\' 10"  (1.778 m), weight 104.5 kg (230 lb 6.1 oz), SpO2 99.00%. Weight change: 1.2 kg (2 lb 10.3 oz)  Physical Exam: General: Well appearing. No respiratory difficulty  HEENT: normal  Neck: supple. Thick. no JVD. Carotids 2+ bilat; no bruits. No lymphadenopathy or thryomegaly appreciated.  Cor: PMI nondisplaced. Regular rate & rhythm. No rubs, gallops or murmurs.  Lungs: clear  Abdomen: obese soft, nontender, nondistended. No hepatosplenomegaly. No bruits or masses. Good bowel sounds.  Extremities: no cyanosis, clubbing, rash, edema  Neuro: alert & oriented x 3, cranial nerves grossly intact. moves all 4 extremities w/o difficulty.  Affect pleasant.  Neuro: alert & orientedx3, cranial nerves grossly intact. moves all 4 extremities w/o difficulty. Affect pleasant  Telemetry: SR 90s  Lab Results: Basic Metabolic Panel:  Lab 12/31/11 5621 12/30/11 0003 12/29/11 0524 12/28/11 0500 12/27/11 1825 12/27/11 0917  NA 131* 125* 132* 133* -- 132*  K 4.0 3.8 -- -- -- --  CL 94* 91* 95* 96 -- 93*  CO2 25 25 26 27  -- 29  GLUCOSE 209* 326* 229* 233* -- 247*  BUN 10 12 12 9  -- 10  CREATININE 0.58 0.61 0.56 0.52 0.52 --  CALCIUM 9.9 9.1 9.4 9.2 -- 9.0  MG -- -- -- -- -- --  PHOS -- -- -- -- -- --   Liver Function Tests: No results found for this basename: AST:5,ALT:5,ALKPHOS:5,BILITOT:5,PROT:5,ALBUMIN:5 in the last 168 hours No results found for this basename: LIPASE:5,AMYLASE:5 in the last 168 hours No results found for this basename: AMMONIA:5 in the last 168 hours CBC:  Lab 12/30/11 0003 12/28/11 0500 12/27/11 1825 12/27/11 0535 12/26/11 0547 12/25/11 0731  WBC 8.3 7.5 7.2 8.7 9.6 --  NEUTROABS -- -- -- -- -- 8.2*  HGB 12.6* 13.3 12.9* 13.0 13.5 --  HCT 34.6* 37.1* 35.5* 35.5* 37.3* --  MCV 88.5 89.6 89.0 89.0 90.5 --  PLT 210 197 190  193 199 --   Cardiac Enzymes:  Lab 12/25/11 2330 12/25/11 1633 12/25/11 0904  CKTOTAL 120 130 159  CKMB 4.4* 6.1* 4.9*  CKMBINDEX -- -- --  TROPONINI 7.65* 7.56* 3.58*   BNP: No components found with this basename: POCBNP:5 CBG:  Lab 12/31/11 0613 12/30/11 2045 12/30/11 1640 12/30/11 1132 12/30/11 0622  GLUCAP 197* 256* 289* 268* 226*   Microbiology: No results found for this basename: cult   No results found for this basename: CULT:2,SDES:2 in the last 168 hours  Imaging: No results found.   ASSESSMENT: 1) Acute Systolic HF  2) NICM, EF 20-25%  3) DM, newly diagnosed  4) NSTEMI  5) CAD s/p PCI to RCA with BMS  - on plavix/ASA  6) Dyslipidemia  7) Obesity  8) Tachycardia  9) Emphysema  10) Scattered small lung nodules per CTA this admission  11) Hyponatremia  - resolved  12) Diarrhea  PLAN/DISCUSSION:  HF much improved. Volume status looks good. Given diarrhea will watch him through lunch, if feeling Ok will d/c this afternoon.   Sen home on current meds with increase of spiro to 25. Can change Crestor to atorva 80 (generic)  Will need close f/u in HF clinic next week with BMET and BNP.   If diarrhea recurs check C. Diff.  LOS: 6 days    Arvilla Meres, MD 12/31/2011, 8:13 AM

## 2011-12-31 NOTE — Discharge Summary (Signed)
Discharge Summary   Patient ID: [name] MRN: [id number], DOB/AGE: [birth year] 56 y.o. Admit date: 12/25/2011 D/C date:     12/31/2011   Primary Discharge Diagnoses:  1. Newly diagnosed single-vessel CAD by cath 12/27/11 - s/p BMS to the RCA (95% eccentric lesion proximally, 30-40% distal) 2. Newly diagnosed acute systolic CHF - possibly NICM  (felt secondary to hypertension and diabetes) based on degree of LV dysfunction in the setting of single-vessel CAD - EF 20-25% by echo 12/25/11 3. HTN 4. Hyperlipidemia 5. Newly diagnosed diabetes mellitus, A1C of 9.6 6. PSVT 7. Abnormal chest CT this admission - see below - pulmonary nodules and soft tissue density on right posterior chest wall - patient made aware and instructed to f/u PCP  Secondary Discharge Diagnoses:  1. Arthritis 2. Skin cancer on left ear  Hospital Course: [name] is a 56 y.o. gentlemen with history of HTN and prediabetes that presented to [facility] with dyspnea and chest pressure. He ruled out for PE. He was found to have a NSTEMI and new onset heart failure and was initially treated with IV nitroglycerin, anticoagulation, statin, aspirin, lasix, & olmesartan. EKG showed evidence of ST segment elevation in leads V1-V3 admission to the same in leads 3. Echo demonstrated LV dysfunction with EF of 20-25%. The patient underwent cardiac catheterization on 12/27/11 which demonstrated a 95% stenosis in the proximal RCA otherwise normal vessels, underwent PTCA with a BMS by Dr. Swaziland on 1/4 with recommendation for Plavix/ASA for at least 1 month. His LV dysfunction was felt possibly nonischemic based on the degree of LV dysfunction in the setting of single-vessel CAD. He has been continued on coreg, lasix, benicar, Kdur, and spironolactone for his systolic heart failure. He diuresed and feels much improved. Prior to progressive dyspnea and fatigue over the last several weeks he noted no limitations in his activities. During his hospital stay he  has also been found to have diabetes with a HgA1c of 9.6 and hyperlipidemia. He received education regarding this and was temporarily started on insulin. He had PSVT and BB was increased. Dr. Graciela Husbands asked Dr. Gala Romney to weigh in regarding the patient's heart failure management. Dr. Prescott Gum team provided extensive education to the patient and felt he was euvolemic. They felt the patient's tachycardia was probably due to his systolic dysfunction and this should improve with recovery of EF. It was felt he would benefit from close outpatient f/u in the CHF clinic. Per discussion with Dr. Gala Romney, the patient is ok to be discharged with metformin and amaryl per DM coordinator recommendations. His potassium will be decreased to daily in light of an increase in spironolactone today. Otherwise, see below for comprehensive discharge med list. The patient has had some diarrhea this morning that has since resolved. Dr. Gala Romney has seen & examined him today and feels he is stable for discharge if he is still feeling well this afternoon. We will also obtain LFT's for baseline prior to discharge.  Discharge Vitals: Blood pressure 124/85, pulse 99, temperature 97.4 F (36.3 C), temperature source Oral, resp. rate 18, height 5\' 10"  (1.778 m), weight 230 lb 6.1 oz (104.5 kg), SpO2 99.00%.  Labs: Lab Results  Component Value Date   WBC 8.3 12/30/2011   HGB 12.6* 12/30/2011   HCT 34.6* 12/30/2011   MCV 88.5 12/30/2011   PLT 210 12/30/2011    Lab 12/31/11 0540  NA 131*  K 4.0  CL 94*  CO2 25  BUN 10  CREATININE 0.58  CALCIUM 9.9  PROT --  BILITOT --  ALKPHOS --  ALT --  AST --  GLUCOSE 209*    Lab Results  Component Value Date   CHOL 203* 12/25/2011   HDL 44 12/25/2011   LDLCALC 409* 12/25/2011   TRIG 152* 12/25/2011     Diagnostic Studies/Procedures   1. Chest 2 View 12/25/2011  *RADIOLOGY REPORT*  Clinical Data: Chest pain  CHEST - 2 VIEW  Comparison: None  Findings: Midline trachea.  Mild  cardiomegaly. Mediastinal contours otherwise within normal limits.  Small bilateral pleural effusions. No pneumothorax.  Mild interstitial edema. Clear lungs.  IMPRESSION: Mild congestive heart failure with small bilateral pleural effusions.  Original Report Authenticated By: Consuello Bossier, M.D.   2. Ct Angio Chest W/cm &/or Wo Cm 12/25/2011 CT ANGIOGRAPHY OF THE CHEST  Technique:  Multidetector CT angiography of the chest was performed after contrast with bolus timed to evaluate the pulmonary arteries. Multiplanar CT image reconstructions including MIPs we  IMPRESSION: 1. No evidence of pulmonary embolism.  2.  Cardiomegaly and small bilateral pleural effusions.  Favor mild pulmonary venous congestion. 3.  Moderate centrilobular emphysema with scattered small lung nodules. Given risk factors for bronchogenic carcinoma, follow-up chest CT at 1 year is recommended.  This recommendation follows the consensus statement:  "Guidelines for Management of Small Pulmonary Nodules Detected on CT Scans:  A Statement from the Fleischner Society" as published in Radiology 2005; 237:395-400.  Available online at:  DietDisorder.cz. 4.  Mild thoracic adenopathy, favored to be reactive or related to congestive heart failure.  Consider follow-up with chest CT at 3 - 6 months. 5.  Fat/calcific/soft tissue density lesion within the right posterior chest musculature.  Although this could represent a lipoma, liposarcoma cannot be excluded.  Consider imaging surveillance versus excision. 6.  Left chest wall subcutaneous lesion is favored to represent a sebaceous cyst.  There is increased density dependently which could represent complexity.  Recommend physical exam correlation to exclude alternate etiologies.  Original Report Authenticated By: Consuello Bossier, M.D.   3. 2D echo 12/25/2011 Study Conclusions  - Left ventricle: The cavity size was mildly dilated. Wall thickness was increased in a  pattern of mild LVH. Systolic function was severely reduced. The estimated ejection fraction was in the range of 20% to 25%. Diffuse hypokinesis. - Left atrium: The atrium was mildly dilated.  Impressions:- Technically difficult; LV function appears to be severely reduced with global hypokinesis.   Discharge Medications   Current Discharge Medication List    START taking these medications   Details  aspirin EC 81 MG EC tablet Take 1 tablet (81 mg total) by mouth daily.    atorvastatin (LIPITOR) 80 MG tablet Take 1 tablet (80 mg total) by mouth at bedtime. Qty: 30 tablet, Refills: 2    carvedilol (COREG) 12.5 MG tablet Take 1.5 tablets (18.75 mg total) by mouth 2 (two) times daily with a meal. Qty: 90 tablet, Refills: 2    clopidogrel (PLAVIX) 75 MG tablet Take 1 tablet (75 mg total) by mouth daily with breakfast. Talk to your doctor before refilling this prescription to see if you need to continue taking it. Qty: 30 tablet, Refills: 1    furosemide (LASIX) 40 MG tablet Take 1 tablet (40 mg total) by mouth daily. Qty: 30 tablet, Refills: 2    glimepiride (AMARYL) 2 MG tablet Take 1 tablet (2 mg total) by mouth daily before breakfast. Start with 1 tablet daily - please see your primary  care doctor within 1 week to work with you in adjusting your diabetes medicines. Qty: 30 tablet, Refills: 0    lisinopril (PRINIVIL,ZESTRIL) 10 MG tablet Take 1 tablet (10 mg total) by mouth daily. Qty: 30 tablet, Refills: 2    metFORMIN (GLUCOPHAGE) 500 MG tablet Take 1 tablet (500 mg total) by mouth daily with breakfast. Start with 1 tablet daily - please see your primary care doctor within 1 week to work with you in adjusting your diabetes medicines. Qty: 30 tablet, Refills: 0    nitroGLYCERIN (NITROSTAT) 0.4 MG SL tablet Place 1 tablet (0.4 mg total) under the tongue every 5 (five) minutes as needed for chest pain (up to 3 doses). Qty: 25 tablet, Refills: 3    potassium chloride SA (K-DUR,KLOR-CON)  20 MEQ tablet Take 1 tablet (20 mEq total) by mouth daily. Qty: 30 tablet, Refills: 2    spironolactone (ALDACTONE) 25 MG tablet Take 1 tablet (25 mg total) by mouth daily. Qty: 30 tablet, Refills: 2      STOP taking these medications     Loratadine-Pseudoephedrine (CLARITIN-D 12 HOUR PO)       He was given an rx for a glucometer, test strips and lancets.  Disposition   The patient will be discharged in stable condition to home. Discharge Orders    Future Appointments: Provider: Department: Dept Phone: Center:   01/07/2012 3:45 PM [facility] [phone] None     Future Orders Please Complete By Expires   Diet - low sodium heart healthy      Comments:   Diabetic diet   Increase activity slowly      Comments:   No driving for 2 days. No lifting over 5 lbs for 1 week. No sexual activity for 1 week.     Discharge wound care:      Comments:   Keep procedure site clean & dry. If you notice increased pain, swelling, bleeding or pus, call/return!  You may shower, but no soaking baths/hot tubs/pools for 1 week.     Discharge instructions      Comments:   Check your blood sugar once daily and keep a log to take to your primary care doctor's office.     Follow-up Information    Follow up with [facility]. (01/07/12 at 3:45pm. You will also have labwork that day.)    Contact information:   [facility] [phone] Parking Garage Parking Code [phone]      Follow up with Primary Care Doctor. (Please see primary doctor within 1 week to discuss your new diagnosis of diabetes including making a plan for management and follow-up.)       Follow up with Primary Care Doctor. (Please see to discuss follow-up of findings on your chest CT. You had nonspecific pulmonary nodules which may require a repeat CT to ensure cleaning. There also was an area of dense soft tissue seen on your back that may need  surveillance.)            Duration of Discharge Encounter: Greater than 30 minutes including physician and PA time.  Signed, Rondy Krupinski PA-C 12/31/2011, 11:03 AM

## 2011-12-31 NOTE — Progress Notes (Signed)
Inpatient Diabetes Program Recommendations  AACE/ADA: New Consensus Statement on Inpatient Glycemic Control (2009)  Target Ranges:  Prepandial:   less than 140 mg/dL      Peak postprandial:   less than 180 mg/dL (1-2 hours)      Critically ill patients:  140 - 180 mg/dL   Reason:  In addition to oral meds would also recommend basal insulin. CBGs have been consistently >200.  Inpatient Diabetes Program Recommendations Insulin - Basal: Lantus 20 units at HS Oral Agents: If no contraindications, would recommend starting Metformin 500mg  daily to start. If tolerates can increase dose.  Also, Amaryl 2mg  daily to start. Outpatient Referral: [facility]  Spoke with patient concerning the possible need for insulin at home, especially in the beginning.  He was accepting of it and has even given himself insulin.   Thanks

## 2011-12-31 NOTE — Progress Notes (Signed)
12/31/11 1400--UR completed. Tera Mater, RN, BSN Nurse Case Manager

## 2012-01-06 NOTE — Discharge Summary (Signed)
CHF and IV CAD  On medical therapy improvd

## 2012-01-07 ENCOUNTER — Inpatient Hospital Stay (HOSPITAL_COMMUNITY): Admit: 2012-01-07 | Payer: BC Managed Care – PPO

## 2012-01-09 ENCOUNTER — Ambulatory Visit (HOSPITAL_COMMUNITY)
Admission: RE | Admit: 2012-01-09 | Discharge: 2012-01-09 | Disposition: A | Discharge status: Home / Self Care | Payer: BC Managed Care – PPO | Source: Ambulatory Visit | Attending: Internal Medicine | Admitting: Internal Medicine

## 2012-01-09 VITALS — BP 118/76 | HR 97 | Wt 231.5 lb

## 2012-01-09 DIAGNOSIS — I5022 Chronic systolic (congestive) heart failure: Secondary | ICD-10-CM | POA: Insufficient documentation

## 2012-01-09 NOTE — Assessment & Plan Note (Addendum)
Volume status looks good today.  NYHA II.  Will continue current medications.  Have discussed sliding scale lasix and he voices understanding.  Next visit will plan to titrate lisinopril if BP allows.  Optimistic EF will recover.    Patient seen and examined with Ulyess Blossom PA-C. We discussed all aspects of the encounter. I agree with the assessment and plan as stated above. Reinforced need for daily weights and reviewed use of sliding scale diuretics.

## 2012-01-09 NOTE — Progress Notes (Signed)
HPI: [name] is a 56 y.o. gentlemen with history of HTN and prediabetes.  He presented to [facility] with dyspnea and chest pressure and found to have a NSTEMI and new onset heart failure.  EF 20-25%.  Underwent cardiac catheterization on 12/27/11 which demonstrated a 95% stenosis in the proximal RCA otherwise normal vessels, underwent PTCA with a BMS by Dr. Swaziland on 1/4 with recommendation for Plavix/ASA for at least 1 month.   Here for post hospital follow up.  He is doing really well.  Steady weight at home, 227-230.  No extra lasix.  Following a low salt diet.  No SOB/orthopnea/PND.  No CP.  He has noticed he needs his glasses more for increased blurriness but he is going to see his eye doctor tomorrow.  Medication compliance.      Review of Systems: As in HPI  Past Medical History  Diagnosis Date  . 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 Hypertension   . Arthritis   . Hyperlipidemia   . Diabetes mellitus     Newly diagnosed 12/2011 with A1C of 9.6  . PSVT (paroxysmal supraventricular tachycardia)   . CAD (coronary artery disease)     Newly diagnosed single-vessel CAD by cath 12/27/11 s/p BMS to RCA (95% eccentric lesion proximally, 30-40% distally)  . Systolic CHF     Diagnosed 12/2011 - possibly NICM felt secondary to hypertension and diabetes, given degree of LV dysfunction in the setting of single-vessel CAD  . Abnormal chest CT     Pulmonary nodules, right posterior chest wall soft tissue density - recommended to f/u PCP    Current Outpatient Prescriptions  Medication Sig Dispense Refill  . aspirin EC 81 MG EC tablet Take 1 tablet (81 mg total) by mouth daily.      Marland Kitchen atorvastatin (LIPITOR) 80 MG tablet Take 1 tablet (80 mg total) by mouth at bedtime.  30 tablet  2  . carvedilol (COREG) 12.5 MG tablet Take 1.5 tablets (18.75 mg total) by mouth 2 (two) times daily with a meal.  90 tablet  2  . clopidogrel (PLAVIX) 75 MG tablet Take 1 tablet (75 mg total) by mouth daily with breakfast.  Talk to your doctor before refilling this prescription to see if you need to continue taking it.  30 tablet  1  . furosemide (LASIX) 40 MG tablet Take 1 tablet (40 mg total) by mouth daily.  30 tablet  2  . glimepiride (AMARYL) 2 MG tablet Take 1 tablet (2 mg total) by mouth daily before breakfast. Start with 1 tablet daily - please see your primary care doctor within 1 week to work with you in adjusting your diabetes medicines.  30 tablet  0  . lisinopril (PRINIVIL,ZESTRIL) 10 MG tablet Take 1 tablet (10 mg total) by mouth daily.  30 tablet  2  . metFORMIN (GLUCOPHAGE) 500 MG tablet Take 1 tablet (500 mg total) by mouth daily with breakfast. Start with 1 tablet daily - please see your primary care doctor within 1 week to work with you in adjusting your diabetes medicines.  30 tablet  0  . nitroGLYCERIN (NITROSTAT) 0.4 MG SL tablet Place 1 tablet (0.4 mg total) under the tongue every 5 (five) minutes as needed for chest pain (up to 3 doses).  25 tablet  3  . potassium chloride SA (K-DUR,KLOR-CON) 20 MEQ tablet Take 1 tablet (20 mEq total) by mouth daily.  30 tablet  2  . spironolactone (ALDACTONE)  25 MG tablet Take 1 tablet (25 mg total) by mouth daily.  30 tablet  2    No Known Allergies   PHYSICAL EXAM: Filed Vitals:   01/09/12 1548  BP: 118/76  Pulse: 97  Weight: 231 lb 8 oz (105.008 kg)  SpO2: 75%    General:  Well appearing. No respiratory difficulty HEENT: normal Neck: supple. no JVD. Carotids 2+ bilat; no bruits. No lymphadenopathy or thryomegaly appreciated. Cor: PMI nondisplaced. Regular rate & rhythm. No rubs, gallops or murmurs. Lungs: clear Abdomen: soft, nontender, nondistended. No hepatosplenomegaly. No bruits or masses. Good bowel sounds. Extremities: no cyanosis, clubbing, rash, edema Neuro: alert & oriented x 3, cranial nerves grossly intact. moves all 4 extremities w/o difficulty. Affect pleasant.   ASSESSMENT & PLAN:

## 2012-01-09 NOTE — Patient Instructions (Signed)
May return to work.  Continue current medications.  Follow up 1 month.  Do the following things EVERYDAY: 1) Weigh yourself in the morning before breakfast. Write it down and keep it in a log. 2) Take your medicines as prescribed 3) Eat low salt foods-Limit salt (sodium) to 2000mg  per day.  4) Stay as active as you can everyday

## 2012-01-09 NOTE — Assessment & Plan Note (Addendum)
No ischemic symptoms.  Continue current meds.  He has been cleared to go back to work next week.

## 2012-02-03 ENCOUNTER — Other Ambulatory Visit: Payer: Self-pay | Admitting: *Deleted

## 2012-02-03 MED ORDER — CLOPIDOGREL BISULFATE 75 MG PO TABS: 75.0000 mg | ORAL_TABLET | Freq: Every day | ORAL | Status: DC

## 2012-02-03 NOTE — Telephone Encounter (Signed)
Refilled generic plavix 

## 2012-02-10 ENCOUNTER — Other Ambulatory Visit: Payer: Self-pay | Admitting: Internal Medicine

## 2012-02-10 ENCOUNTER — Encounter: Payer: Self-pay | Admitting: Cardiovascular Disease

## 2012-02-10 NOTE — Telephone Encounter (Signed)
error 

## 2012-02-14 ENCOUNTER — Other Ambulatory Visit: Payer: Self-pay | Admitting: *Deleted

## 2012-02-17 ENCOUNTER — Ambulatory Visit (HOSPITAL_COMMUNITY)
Admission: RE | Admit: 2012-02-17 | Discharge: 2012-02-17 | Disposition: A | Discharge status: Home / Self Care | Payer: BC Managed Care – PPO | Source: Ambulatory Visit | Attending: Internal Medicine | Admitting: Internal Medicine

## 2012-02-17 VITALS — BP 130/70 | HR 81 | Wt 236.5 lb

## 2012-02-17 DIAGNOSIS — I5022 Chronic systolic (congestive) heart failure: Secondary | ICD-10-CM | POA: Insufficient documentation

## 2012-02-17 DIAGNOSIS — I2510 Atherosclerotic heart disease of native coronary artery without angina pectoris: Secondary | ICD-10-CM

## 2012-02-17 LAB — BASIC METABOLIC PANEL
BUN: 11 mg/dL (ref 6–23)
CO2: 30 mEq/L (ref 19–32)
Calcium: 10.1 mg/dL (ref 8.4–10.5)
Creatinine, Ser: 0.63 mg/dL (ref 0.50–1.35)
GFR calc non Af Amer: >90 mL/min (ref >90)
Glucose, Bld: 127 mg/dL — ABNORMAL HIGH (ref 70–99)
Sodium: 130 mEq/L — ABNORMAL LOW (ref 135–145)

## 2012-02-17 MED ORDER — LISINOPRIL 10 MG PO TABS: 10.0000 mg | ORAL_TABLET | Freq: Two times a day (BID) | ORAL | Status: DC

## 2012-02-17 NOTE — Assessment & Plan Note (Signed)
No ischemic symptoms.  It has been almost 2 months since BMS to RCA.  He is eager to stop Plavix, I believe this is ok.  Will continue ASA and Lipitor.

## 2012-02-17 NOTE — Progress Notes (Signed)
HPI: [name] is a 56 y.o. gentlemen with history of HTN and prediabetes.  He presented to [facility] with dyspnea and chest pressure and found to have a NSTEMI and new onset heart failure.  EF 20-25%.  Underwent cardiac catheterization on 12/27/11 which demonstrated a 95% stenosis in the proximal RCA otherwise normal vessels, underwent PTCA with a BMS by Dr. Swaziland on 1/4 with recommendation for Plavix/ASA for at least 1 month.   Here for post hospital follow up.  He is doing really well.  Steady weight at home, 227-229.  No extra lasix.  Following a low salt diet.  No SOB/orthopnea/PND.  No CP.  He has not experienced any more blurred vision.  He is having pain in his right wrist, which he uses to bowl but he is going to see his PCP on Thursday.  Medication compliance.      Review of Systems: As in HPI  Past Medical History  Diagnosis Date  . 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 Hypertension   . Arthritis   . Hyperlipidemia   . Diabetes mellitus     Newly diagnosed 12/2011 with A1C of 9.6  . PSVT (paroxysmal supraventricular tachycardia)   . CAD (coronary artery disease)     Newly diagnosed single-vessel CAD by cath 12/27/11 s/p BMS to RCA (95% eccentric lesion proximally, 30-40% distally)  . Systolic CHF     Diagnosed 12/2011 - possibly NICM felt secondary to hypertension and diabetes, given degree of LV dysfunction in the setting of single-vessel CAD  . Abnormal chest CT     Pulmonary nodules, right posterior chest wall soft tissue density - recommended to f/u PCP    Current Outpatient Prescriptions  Medication Sig Dispense Refill  . aspirin EC 81 MG EC tablet Take 1 tablet (81 mg total) by mouth daily.      Marland Kitchen atorvastatin (LIPITOR) 80 MG tablet Take 1 tablet (80 mg total) by mouth at bedtime.  30 tablet  2  . carvedilol (COREG) 12.5 MG tablet Take 1.5 tablets (18.75 mg total) by mouth 2 (two) times daily with a meal.  90 tablet  2  . clopidogrel (PLAVIX) 75 MG tablet Take 1 tablet (75 mg  total) by mouth daily with breakfast. Talk to your doctor before refilling this prescription to see if you need to continue taking it.  30 tablet  11  . furosemide (LASIX) 40 MG tablet Take 1 tablet (40 mg total) by mouth daily.  30 tablet  2  . glimepiride (AMARYL) 2 MG tablet Take 3 mg by mouth daily before breakfast. Start with 1 tablet daily - please see your primary care doctor within 1 week to work with you in adjusting your diabetes medicines.      Marland Kitchen linagliptin (TRADJENTA) 5 MG TABS tablet Take 5 mg by mouth daily.      Marland Kitchen lisinopril (PRINIVIL,ZESTRIL) 10 MG tablet Take 1 tablet (10 mg total) by mouth daily.  30 tablet  2  . nitroGLYCERIN (NITROSTAT) 0.4 MG SL tablet Place 1 tablet (0.4 mg total) under the tongue every 5 (five) minutes as needed for chest pain (up to 3 doses).  25 tablet  3  . potassium chloride SA (K-DUR,KLOR-CON) 20 MEQ tablet Take 1 tablet (20 mEq total) by mouth daily.  30 tablet  2  . spironolactone (ALDACTONE) 25 MG tablet Take 1 tablet (25 mg total) by mouth daily.  30 tablet  2    No Known  Allergies   PHYSICAL EXAM: Filed Vitals:   02/17/12 1542  BP: 130/70  Pulse: 81  Weight: 236 lb 8 oz (107.276 kg)  SpO2: 99%    General:  Well appearing. No respiratory difficulty HEENT: normal Neck: supple. no JVD. Carotids 2+ bilat; no bruits. No lymphadenopathy or thryomegaly appreciated. Cor: PMI nondisplaced. Regular rate & rhythm. No rubs, gallops or murmurs. Lungs: clear Abdomen: soft, nontender, nondistended. No hepatosplenomegaly. No bruits or masses. Good bowel sounds. Extremities: no cyanosis, clubbing, rash, edema Neuro: alert & oriented x 3, cranial nerves grossly intact. moves all 4 extremities w/o difficulty. Affect pleasant.   ASSESSMENT & PLAN:

## 2012-02-17 NOTE — Patient Instructions (Addendum)
Labs today.  Can stop Plavix today.    Take lisinopril twice daily.    Follow up 4 weeks.

## 2012-02-17 NOTE — Assessment & Plan Note (Signed)
Volume status looks good today.  NYHA II.  Will continue current medications but increase lisinopril to twice daily.  He is to call if he experiences dizziness/lightheadedness  Have discussed sliding scale lasix and he voices understanding.  Will follow up in 4 weeks for possible medication titration then recheck echo.

## 2012-02-18 ENCOUNTER — Ambulatory Visit (INDEPENDENT_AMBULATORY_CARE_PROVIDER_SITE_OTHER): Payer: BC Managed Care – PPO | Admitting: Internal Medicine

## 2012-02-18 ENCOUNTER — Other Ambulatory Visit: Payer: Self-pay | Admitting: *Deleted

## 2012-02-18 ENCOUNTER — Encounter: Payer: Self-pay | Admitting: Internal Medicine

## 2012-02-18 VITALS — BP 112/80 | HR 74 | Temp 97.8°F | Ht 71.0 in | Wt 238.8 lb

## 2012-02-18 DIAGNOSIS — J438 Other emphysema: Secondary | ICD-10-CM

## 2012-02-18 DIAGNOSIS — R222 Localized swelling, mass and lump, trunk: Secondary | ICD-10-CM

## 2012-02-18 DIAGNOSIS — E119 Type 2 diabetes mellitus without complications: Secondary | ICD-10-CM

## 2012-02-18 DIAGNOSIS — R911 Solitary pulmonary nodule: Secondary | ICD-10-CM

## 2012-02-18 DIAGNOSIS — R9389 Abnormal findings on diagnostic imaging of other specified body structures: Secondary | ICD-10-CM

## 2012-02-18 DIAGNOSIS — J302 Other seasonal allergic rhinitis: Secondary | ICD-10-CM

## 2012-02-18 DIAGNOSIS — J309 Allergic rhinitis, unspecified: Secondary | ICD-10-CM

## 2012-02-18 NOTE — Patient Instructions (Addendum)
#  Spring allergies  - try one of the sample nasal steroids 2 squirts each nostril daily  -if works well we can send script #Chest lumps   - please talk to Dr Selena Batten about the 2 of of them Left chest and rt back #lung nodules and mediastinal nodes  - repeat CT chest with contrast in 6 months #Emphysema  - there is mild on your CT chest. Glad you quit smoking.  - check alpha 1 genetic test because of family history #Followup  - 6 months

## 2012-02-18 NOTE — Progress Notes (Signed)
Subjective:    Patient ID: [name], male    DOB: Mar 04, 1956, 56 y.o.   MRN: [id number]  HPI  IOV 02/18/2012  57 year old male. Body mass index is 33.31 kg/(m^2).  reports that he quit smoking about 9 years ago. His smoking use included Cigarettes. He has a 54 pack-year smoking history. He has never used smokeless tobacco. Brother with copd. States was in hospital in January 2013 for MI. 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 S/p stent and now back to work. EF was down during MI. Currently well without chest pain or MI and able to climb stairs and is at baseline health. HE has long hx of spring nasal allergies with occ eye dryiness - was taking claritin D but stopped due to heart issues. DEnies dx of OSA but admits to rare snoring only at night. Denies day time tiredness. Denies cough, dyspnea, hemoptysis, edema, pnd, orthopnea.      CT chest 12/25/11 IMPRESSION:  1. No evidence of pulmonary embolism.  2. Cardiomegaly and small bilateral pleural effusions. Favor mild  pulmonary venous congestion.  3. Moderate centrilobular emphysema with scattered small lung  nodules. Given risk factors for bronchogenic carcinoma, follow-up  chest CT at 1 year is recommended. This recommendation follows the  consensus statement: "Guidelines for Management of Small Pulmonary  Nodules Detected on CT Scans: A Statement from the Fleischner  Society" as published in Radiology 2005; 237:395-400. Available  online at: DietDisorder.cz.  4. Mild thoracic adenopathy, favored to be reactive or related to  congestive heart failure. Consider follow-up with chest CT at 3 -  6 months.  5. Fat/calcific/soft tissue density lesion within the right  posterior chest musculature. Although this could represent a  lipoma, liposarcoma cannot be excluded. Consider imaging  surveillance versus excision.  6. Left chest wall subcutaneous lesion is favored to represent a    sebaceous cyst. There is increased density dependently which could  represent complexity. Recommend physical exam correlation to  exclude alternate etiologies.  Original Report Authenticated       Past Medical History  Diagnosis Date  . 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 Hypertension   . Arthritis   . Hyperlipidemia   . Diabetes mellitus     Newly diagnosed 12/2011 with A1C of 9.6  . PSVT (paroxysmal supraventricular tachycardia)   . CAD (coronary artery disease)     Newly diagnosed single-vessel CAD by cath 12/27/11 s/p BMS to RCA (95% eccentric lesion proximally, 30-40% distally)  . Systolic CHF     Diagnosed 12/2011 - possibly NICM felt secondary to hypertension and diabetes, given degree of LV dysfunction in the setting of single-vessel CAD  . Abnormal chest CT     Pulmonary nodules, right posterior chest wall soft tissue density - recommended to f/u PCP     Family History  Problem Relation Age of Onset  . Emphysema Mother   . Heart disease Brother   . Heart disease Maternal Grandmother   . Heart disease Paternal Grandfather   . Clotting disorder Brother   . Cancer Paternal Grandfather      History   Social History  . Marital Status: Divorced    Spouse Name: Currently has girlfriend    Number of Children: N/A  . Years of Education: N/A   Occupational History  . IT      at [employer]    Social History Main Topics  . Smoking status: Former Smoker --  1.5 packs/day for 36 years    Types: Cigarettes    Quit date: 10/22/2002  . Smokeless tobacco: Never Used  . Alcohol Use: 14.4 oz/week    24 Cans of beer per week  . Drug Use: No  . Sexually Active: Yes    Birth Control/ Protection: Surgical   Other Topics Concern  . Not on file   Social History Narrative  . No narrative on file     No Known Allergies   Outpatient Prescriptions Prior to Visit  Medication Sig Dispense Refill  . aspirin EC 81 MG EC tablet Take 1 tablet (81 mg total) by mouth daily.       Marland Kitchen atorvastatin (LIPITOR) 80 MG tablet Take 1 tablet (80 mg total) by mouth at bedtime.  30 tablet  2  . carvedilol (COREG) 12.5 MG tablet Take 1.5 tablets (18.75 mg total) by mouth 2 (two) times daily with a meal.  90 tablet  2  . furosemide (LASIX) 40 MG tablet Take 1 tablet (40 mg total) by mouth daily.  30 tablet  2  . glimepiride (AMARYL) 2 MG tablet Take 3 mg by mouth daily before breakfast.       . linagliptin (TRADJENTA) 5 MG TABS tablet Take 5 mg by mouth daily.      Marland Kitchen lisinopril (PRINIVIL,ZESTRIL) 10 MG tablet Take 1 tablet (10 mg total) by mouth 2 (two) times daily.  30 tablet  2  . nitroGLYCERIN (NITROSTAT) 0.4 MG SL tablet Place 1 tablet (0.4 mg total) under the tongue every 5 (five) minutes as needed for chest pain (up to 3 doses).  25 tablet  3  . potassium chloride SA (K-DUR,KLOR-CON) 20 MEQ tablet Take 1 tablet (20 mEq total) by mouth daily.  30 tablet  2  . spironolactone (ALDACTONE) 25 MG tablet Take 1 tablet (25 mg total) by mouth daily.  30 tablet  2      No results found. No results found. .ris   Review of Systems  Constitutional: Negative for fever and unexpected weight change.  HENT: Positive for congestion and sneezing. Negative for ear pain, nosebleeds, sore throat, rhinorrhea, trouble swallowing, dental problem, postnasal drip and sinus pressure.   Eyes: Negative for redness and itching.  Respiratory: Positive for shortness of breath. Negative for cough, chest tightness and wheezing.   Cardiovascular: Negative for palpitations and leg swelling.  Gastrointestinal: Negative for nausea and vomiting.  Genitourinary: Negative for dysuria.  Musculoskeletal: Positive for joint swelling.  Skin: Negative for rash.  Neurological: Negative for headaches.  Hematological: Does not bruise/bleed easily.  Psychiatric/Behavioral: Negative for dysphoric mood. The patient is not nervous/anxious.        Objective:   Physical Exam  Nursing note and vitals  reviewed. Constitutional: He is oriented to person, place, and time. He appears well-developed and well-nourished. No distress.       Body mass index is 33.31 kg/(m^2).   HENT:  Head: Normocephalic and atraumatic.  Right Ear: External ear normal.  Left Ear: External ear normal.  Mouth/Throat: Oropharynx is clear and moist. No oropharyngeal exudate.       mallampatti class 3-4  Eyes: Conjunctivae and EOM are normal. Pupils are equal, round, and reactive to light. Right eye exhibits no discharge. Left eye exhibits no discharge. No scleral icterus.  Neck: Normal range of motion. Neck supple. No JVD present. No tracheal deviation present. No thyromegaly present.  Cardiovascular: Normal rate, regular rhythm and intact distal pulses.  Exam reveals no gallop  and no friction rub.   No murmur heard. Pulmonary/Chest: Effort normal and breath sounds normal. No respiratory distress. He has no wheezes. He has no rales. He exhibits no tenderness.       Left chest above nipple - 2cm ? Lipoma RT suprascapular - 7cm soft tissue swelling   - no change x 20 years  Abdominal: Soft. Bowel sounds are normal. He exhibits no distension and no mass. There is no tenderness. There is no rebound and no guarding.  Musculoskeletal: Normal range of motion. He exhibits no edema and no tenderness.  Lymphadenopathy:    He has no cervical adenopathy.  Neurological: He is alert and oriented to person, place, and time. He has normal reflexes. No cranial nerve deficit. Coordination normal.  Skin: Skin is warm and dry. No rash noted. He is not diaphoretic. No erythema. No pallor.  Psychiatric: He has a normal mood and affect. His behavior is normal. Judgment and thought content normal.          Assessment & Plan:

## 2012-02-24 ENCOUNTER — Encounter: Payer: Self-pay | Admitting: Internal Medicine

## 2012-02-24 ENCOUNTER — Other Ambulatory Visit: Payer: Self-pay | Admitting: *Deleted

## 2012-02-24 DIAGNOSIS — R222 Localized swelling, mass and lump, trunk: Secondary | ICD-10-CM | POA: Insufficient documentation

## 2012-02-24 DIAGNOSIS — J302 Other seasonal allergic rhinitis: Secondary | ICD-10-CM | POA: Insufficient documentation

## 2012-02-24 DIAGNOSIS — R911 Solitary pulmonary nodule: Secondary | ICD-10-CM | POA: Insufficient documentation

## 2012-02-24 MED ORDER — LISINOPRIL 10 MG PO TABS: 10.0000 mg | ORAL_TABLET | Freq: Two times a day (BID) | ORAL | Status: DC

## 2012-02-24 NOTE — Assessment & Plan Note (Signed)
#  Emphysema  - there is mild on your CT chest. Glad you quit smoking.  - check alpha 1 genetic test because of family history #Followup  - 6 months

## 2012-02-24 NOTE — Assessment & Plan Note (Signed)
Repeat ct chest 6 months 

## 2012-02-24 NOTE — Assessment & Plan Note (Signed)
#  Spring allergies  - try one of the sample nasal steroids 2 squirts each nostril daily  -if works well we can send script

## 2012-02-24 NOTE — Assessment & Plan Note (Signed)
#  Chest lumps   - please talk to Dr Selena Batten about the 2 of of them Left chest and rt back #lung nodules and mediastinal nodes  - repeat CT chest with contrast in 6 months

## 2012-02-25 ENCOUNTER — Telehealth (HOSPITAL_COMMUNITY): Payer: Self-pay | Admitting: *Deleted

## 2012-02-25 DIAGNOSIS — I5022 Chronic systolic (congestive) heart failure: Secondary | ICD-10-CM

## 2012-02-25 NOTE — Telephone Encounter (Signed)
Message copied by Noralee Space on Tue Feb 25, 2012 12:20 PM ------      Message from: Arvilla Meres R      Created: Mon Feb 17, 2012  9:51 PM       pk

## 2012-02-27 ENCOUNTER — Other Ambulatory Visit (INDEPENDENT_AMBULATORY_CARE_PROVIDER_SITE_OTHER): Payer: BC Managed Care – PPO

## 2012-02-27 DIAGNOSIS — I5022 Chronic systolic (congestive) heart failure: Secondary | ICD-10-CM

## 2012-02-28 LAB — BASIC METABOLIC PANEL
BUN: 10 mg/dL (ref 6–23)
Calcium: 9.6 mg/dL (ref 8.4–10.5)
Creatinine, Ser: 0.5 mg/dL (ref 0.4–1.5)
GFR: 166.9 mL/min (ref >60.00)
Glucose, Bld: 102 mg/dL — ABNORMAL HIGH (ref 70–99)
Potassium: 4 mEq/L (ref 3.5–5.1)

## 2012-03-05 ENCOUNTER — Other Ambulatory Visit: Payer: Self-pay

## 2012-03-05 MED ORDER — ATORVASTATIN CALCIUM 80 MG PO TABS: 80.0000 mg | ORAL_TABLET | Freq: Every day | ORAL | Status: DC

## 2012-03-05 MED ORDER — POTASSIUM CHLORIDE CRYS ER 20 MEQ PO TBCR: 20.0000 meq | EXTENDED_RELEASE_TABLET | Freq: Every day | ORAL | Status: DC

## 2012-03-19 ENCOUNTER — Ambulatory Visit (HOSPITAL_COMMUNITY)
Admission: RE | Admit: 2012-03-19 | Discharge: 2012-03-19 | Disposition: A | Discharge status: Home / Self Care | Payer: BC Managed Care – PPO | Source: Ambulatory Visit | Attending: Internal Medicine | Admitting: Internal Medicine

## 2012-03-19 ENCOUNTER — Encounter: Payer: Self-pay | Admitting: Internal Medicine

## 2012-03-19 VITALS — BP 122/64 | HR 82 | Wt 241.0 lb

## 2012-03-19 DIAGNOSIS — I5022 Chronic systolic (congestive) heart failure: Secondary | ICD-10-CM | POA: Insufficient documentation

## 2012-03-19 DIAGNOSIS — I2510 Atherosclerotic heart disease of native coronary artery without angina pectoris: Secondary | ICD-10-CM | POA: Insufficient documentation

## 2012-03-19 MED ORDER — CARVEDILOL 25 MG PO TABS: 25.0000 mg | ORAL_TABLET | Freq: Two times a day (BID) | ORAL | Status: DC

## 2012-03-19 NOTE — Assessment & Plan Note (Signed)
No ischemic symptoms, continue current regimens.

## 2012-03-19 NOTE — Assessment & Plan Note (Signed)
Volume status looks great today.  NYHA I.  Will titrate carvedilol 25 mg BID.  Will recheck echo in 2 months at next visit.  Encourage him to continue exercising.

## 2012-03-19 NOTE — Progress Notes (Signed)
HPI: [name] is a 56 y.o. gentlemen with history of HTN and prediabetes.  He presented to [facility] with dyspnea and chest pressure and found to have a NSTEMI and new onset heart failure.  EF 20-25%.  Underwent cardiac catheterization on 12/27/11 which demonstrated a 95% stenosis in the proximal RCA otherwise normal vessels, underwent PTCA with a BMS by Dr. Swaziland on 1/4 with recommendation for Plavix/ASA for at least 1 month.    Here for follow up today.  He is doing really well.  His weight at home remains between 227-230.  He is trying hard to follow a low sodium diet.  He denies SOB/orthopnea/PND.  No chest pain.  He is complaint with his medication.  He is exercising more.  He went out kayaking last weekend and was able to go 4-5 miles.     Review of Systems: Pertinent positives and negatives as in HPI, otherwise negative.   Past Medical History  Diagnosis Date  . 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 Hypertension   . Arthritis   . Hyperlipidemia   . Diabetes mellitus     Newly diagnosed 12/2011 with A1C of 9.6  . PSVT (paroxysmal supraventricular tachycardia)   . CAD (coronary artery disease)     Newly diagnosed single-vessel CAD by cath 12/27/11 s/p BMS to RCA (95% eccentric lesion proximally, 30-40% distally)  . Systolic CHF     Diagnosed 12/2011 - possibly NICM felt secondary to hypertension and diabetes, given degree of LV dysfunction in the setting of single-vessel CAD  . Abnormal chest CT     Pulmonary nodules, right posterior chest wall soft tissue density - recommended to f/u PCP    Current Outpatient Prescriptions  Medication Sig Dispense Refill  . aspirin EC 81 MG EC tablet Take 1 tablet (81 mg total) by mouth daily.      Marland Kitchen atorvastatin (LIPITOR) 80 MG tablet Take 1 tablet (80 mg total) by mouth at bedtime.  30 tablet  2  . carvedilol (COREG) 12.5 MG tablet Take 1.5 tablets (18.75 mg total) by mouth 2 (two) times daily with a meal.  90 tablet  2  . furosemide (LASIX) 40 MG  tablet Take 1 tablet (40 mg total) by mouth daily.  30 tablet  2  . glimepiride (AMARYL) 2 MG tablet Take 3 mg by mouth daily before breakfast.       . linagliptin (TRADJENTA) 5 MG TABS tablet Take 5 mg by mouth daily.      Marland Kitchen lisinopril (PRINIVIL,ZESTRIL) 10 MG tablet Take 1 tablet (10 mg total) by mouth 2 (two) times daily.  60 tablet  2  . nitroGLYCERIN (NITROSTAT) 0.4 MG SL tablet Place 1 tablet (0.4 mg total) under the tongue every 5 (five) minutes as needed for chest pain (up to 3 doses).  25 tablet  3  . potassium chloride SA (K-DUR,KLOR-CON) 20 MEQ tablet Take 1 tablet (20 mEq total) by mouth daily.  30 tablet  2  . spironolactone (ALDACTONE) 25 MG tablet Take 1 tablet (25 mg total) by mouth daily.  30 tablet  2    No Known Allergies   PHYSICAL EXAM: Filed Vitals:   03/19/12 1558  BP: 122/64  Pulse: 82  Weight: 241 lb (109.317 kg)  SpO2: 100%    General:  Well appearing. No respiratory difficulty HEENT: normal Neck: supple. no JVD. Carotids 2+ bilat; no bruits. No lymphadenopathy or thryomegaly appreciated. Cor: PMI nondisplaced. Regular rate & rhythm.  No rubs, gallops or murmurs. Lungs: clear Abdomen: obese, soft, nontender, nondistended. No hepatosplenomegaly. No bruits or masses. Good bowel sounds. Extremities: no cyanosis, clubbing, rash, edema Neuro: alert & oriented x 3, cranial nerves grossly intact. moves all 4 extremities w/o difficulty. Affect pleasant.   ASSESSMENT & PLAN:

## 2012-03-19 NOTE — Patient Instructions (Signed)
Increase carvedilol to 2 tabs (25 mg) twice daily.  Follow up 2 months.

## 2012-03-30 ENCOUNTER — Other Ambulatory Visit: Payer: Self-pay

## 2012-03-30 MED ORDER — FUROSEMIDE 40 MG PO TABS: 40.0000 mg | ORAL_TABLET | Freq: Every day | ORAL | Status: DC

## 2012-05-20 ENCOUNTER — Encounter (HOSPITAL_COMMUNITY): Payer: BC Managed Care – PPO

## 2012-05-20 ENCOUNTER — Other Ambulatory Visit (HOSPITAL_COMMUNITY): Payer: BC Managed Care – PPO

## 2012-05-24 ENCOUNTER — Other Ambulatory Visit (HOSPITAL_COMMUNITY): Payer: Self-pay | Admitting: Physician Assistant

## 2012-05-27 ENCOUNTER — Ambulatory Visit (HOSPITAL_COMMUNITY)
Admission: RE | Admit: 2012-05-27 | Discharge: 2012-05-27 | Disposition: A | Discharge status: Home / Self Care | Payer: BC Managed Care – PPO | Source: Ambulatory Visit | Attending: Physician Assistant | Admitting: Physician Assistant

## 2012-05-27 ENCOUNTER — Other Ambulatory Visit: Payer: Self-pay | Admitting: Physician Assistant

## 2012-05-27 DIAGNOSIS — E785 Hyperlipidemia, unspecified: Secondary | ICD-10-CM | POA: Insufficient documentation

## 2012-05-27 DIAGNOSIS — I517 Cardiomegaly: Secondary | ICD-10-CM | POA: Insufficient documentation

## 2012-05-27 DIAGNOSIS — I509 Heart failure, unspecified: Secondary | ICD-10-CM | POA: Insufficient documentation

## 2012-05-27 DIAGNOSIS — I10 Essential (primary) hypertension: Secondary | ICD-10-CM | POA: Insufficient documentation

## 2012-05-27 DIAGNOSIS — I5022 Chronic systolic (congestive) heart failure: Secondary | ICD-10-CM

## 2012-05-27 DIAGNOSIS — E119 Type 2 diabetes mellitus without complications: Secondary | ICD-10-CM | POA: Insufficient documentation

## 2012-05-27 DIAGNOSIS — I2510 Atherosclerotic heart disease of native coronary artery without angina pectoris: Secondary | ICD-10-CM | POA: Insufficient documentation

## 2012-05-28 MED ORDER — PARICALCITOL 5 MCG/ML IV SOLN
INTRAVENOUS | Status: AC
  Filled 2012-05-28: qty 2

## 2012-05-28 MED ORDER — HYDROMORPHONE HCL PF 1 MG/ML IJ SOLN
INTRAMUSCULAR | Status: AC
  Filled 2012-05-28: qty 2

## 2012-05-28 MED ORDER — OXYCODONE HCL 5 MG PO TABS
ORAL_TABLET | ORAL | Status: AC
  Filled 2012-05-28: qty 2

## 2012-06-11 ENCOUNTER — Ambulatory Visit (HOSPITAL_COMMUNITY)
Admission: RE | Admit: 2012-06-11 | Discharge: 2012-06-11 | Disposition: A | Discharge status: Home / Self Care | Payer: BC Managed Care – PPO | Source: Ambulatory Visit | Attending: Internal Medicine | Admitting: Internal Medicine

## 2012-06-11 ENCOUNTER — Encounter (HOSPITAL_COMMUNITY): Payer: Self-pay

## 2012-06-11 VITALS — BP 130/66 | HR 86 | Resp 18 | Ht 70.0 in | Wt 252.4 lb

## 2012-06-11 DIAGNOSIS — I2510 Atherosclerotic heart disease of native coronary artery without angina pectoris: Secondary | ICD-10-CM

## 2012-06-11 DIAGNOSIS — I5022 Chronic systolic (congestive) heart failure: Secondary | ICD-10-CM

## 2012-06-11 MED ORDER — LISINOPRIL 20 MG PO TABS: 20.0000 mg | ORAL_TABLET | Freq: Two times a day (BID) | ORAL | Status: DC

## 2012-06-11 NOTE — Progress Notes (Signed)
Patient ID: Goldman Birchall, male   DOB: 31-Jan-1956, 56 y.o.   MRN: [id number]  HPI: Mr. Vinzant is a 57 y.o. gentlemen with history of HTN and prediabetes.  He presented to [facility] with dyspnea and chest pressure and found to have a NSTEMI and new onset heart failure.  EF 20-25%.  Underwent cardiac catheterization on 12/27/11 which demonstrated a 95% stenosis in the proximal RCA otherwise normal vessels, underwent PTCA with a BMS by Dr. Swaziland on 1/4 with recommendation for Plavix/ASA for at least 1 month.   Here for follow up.  At last visit carvedilol increased to 25 bid. Tolerating very well.  Has been feeling great but unfortunately twisted his knee getting a kayak. No SOB/orthopnea/PND. No CP. Weighing himself every day. Weight up about 15 pounds due to inactivity. No edema.   No extra lasix.  Following a low salt diet. Struggling with allergies. Checks BP at [employer] usually 130s/70s.  We reviewed recent echo EF 45-50%. Off Plavix.  Issaiah Seabrooks is following lipids.    Review of Systems: As in HPI  Past Medical History  Diagnosis Date  . 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 Hypertension   . Arthritis   . Hyperlipidemia   . Diabetes mellitus     Newly diagnosed 12/2011 with A1C of 9.6  . PSVT (paroxysmal supraventricular tachycardia)   . CAD (coronary artery disease)     Newly diagnosed single-vessel CAD by cath 12/27/11 s/p BMS to RCA (95% eccentric lesion proximally, 30-40% distally)  . Systolic CHF     Diagnosed 12/2011 - possibly NICM felt secondary to hypertension and diabetes, given degree of LV dysfunction in the setting of single-vessel CAD  . Abnormal chest CT     Pulmonary nodules, right posterior chest wall soft tissue density - recommended to f/u PCP    Current Outpatient Prescriptions  Medication Sig Dispense Refill  . ACCU-CHEK FASTCLIX LANCETS MISC every other day.       Marland Kitchen aspirin EC 81 MG EC tablet Take 1 tablet (81 mg total) by mouth daily.      Marland Kitchen atorvastatin (LIPITOR)  80 MG tablet Take 1 tablet (80 mg total) by mouth at bedtime.  30 tablet  2  . carvedilol (COREG) 25 MG tablet TAKE ONE TABLET (25 MG TOTAL) BY MOUTH TWO TIMES DAILY WITH A MEAL.  60 tablet  1  . furosemide (LASIX) 40 MG tablet Take 1 tablet (40 mg total) by mouth daily.  30 tablet  1  . glimepiride (AMARYL) 2 MG tablet Take 3 mg by mouth daily before breakfast.       . linagliptin (TRADJENTA) 5 MG TABS tablet Take 5 mg by mouth daily.      Marland Kitchen lisinopril (PRINIVIL,ZESTRIL) 10 MG tablet TAKE ONE TABLET BY MOUTH TWICE DAILY  60 tablet  1  . nitroGLYCERIN (NITROSTAT) 0.4 MG SL tablet Place 1 tablet (0.4 mg total) under the tongue every 5 (five) minutes as needed for chest pain (up to 3 doses).  25 tablet  3  . potassium chloride SA (K-DUR,KLOR-CON) 20 MEQ tablet Take 1 tablet (20 mEq total) by mouth daily.  30 tablet  2  . spironolactone (ALDACTONE) 25 MG tablet Take 1 tablet (25 mg total) by mouth daily.  30 tablet  2    No Known Allergies   PHYSICAL EXAM: Filed Vitals:   06/11/12 1547  BP: 130/66  Pulse: 86  Resp: 18  Height: 5\' 10"  (1.778  m)  Weight: 252 lb 6.4 oz (114.488 kg)  SpO2: 98%    General:  Well appearing. No respiratory difficulty HEENT: normal Neck: supple. no JVD. Carotids 2+ bilat; no bruits. No lymphadenopathy or thryomegaly appreciated. Cor: PMI nonpalpable.  Regular rate & rhythm. No rubs, gallops or murmurs. Lungs: clear Abdomen: obese. soft, nontender, nondistended. Good bowel sounds. Extremities: no cyanosis, clubbing, rash, edema Neuro: alert & oriented x 3, cranial nerves grossly intact. moves all 4 extremities w/o difficulty. Affect pleasant.   ASSESSMENT & PLAN:

## 2012-06-11 NOTE — Assessment & Plan Note (Signed)
No evidence of ischemia. Continue current regimen. Encouraged exercise and weight loss.

## 2012-06-11 NOTE — Addendum Note (Signed)
Encounter addended by: Noralee Space, RN on: 06/11/2012  4:30 PM<BR>     Documentation filed: Patient Instructions Section, Orders

## 2012-06-11 NOTE — Assessment & Plan Note (Addendum)
Doing great. NYHA I. Volume status looks good. EF recovered. Will increase lisinopril to 20 bid. Stop potassium. Check BMET in 2 weeks.

## 2012-06-11 NOTE — Patient Instructions (Addendum)
Stop Potassium  Increase Lisinopril to 20 mg Twice daily   Labs in 2 weeks  We will contact you in 3 months to schedule your next appointment.

## 2012-06-14 ENCOUNTER — Other Ambulatory Visit: Payer: Self-pay | Admitting: Cardiology

## 2012-06-26 ENCOUNTER — Other Ambulatory Visit (INDEPENDENT_AMBULATORY_CARE_PROVIDER_SITE_OTHER): Payer: BC Managed Care – PPO

## 2012-06-26 DIAGNOSIS — I5022 Chronic systolic (congestive) heart failure: Secondary | ICD-10-CM

## 2012-06-26 LAB — BASIC METABOLIC PANEL
Calcium: 9.2 mg/dL (ref 8.4–10.5)
Creatinine, Ser: 0.7 mg/dL (ref 0.4–1.5)

## 2012-08-04 ENCOUNTER — Telehealth: Payer: Self-pay | Admitting: Internal Medicine

## 2012-08-04 NOTE — Telephone Encounter (Signed)
Left message for pt to call rose at [place] [phone] to rescheduled his appt if need to call me back [phone] Tobe Sos

## 2012-08-06 ENCOUNTER — Other Ambulatory Visit: Payer: BC Managed Care – PPO

## 2012-09-08 ENCOUNTER — Ambulatory Visit: Payer: BC Managed Care – PPO | Admitting: Internal Medicine

## 2012-09-09 ENCOUNTER — Telehealth: Payer: Self-pay | Admitting: Internal Medicine

## 2012-09-09 ENCOUNTER — Other Ambulatory Visit: Payer: BC Managed Care – PPO

## 2012-09-09 DIAGNOSIS — R911 Solitary pulmonary nodule: Secondary | ICD-10-CM

## 2012-09-09 NOTE — Telephone Encounter (Signed)
lmtcb for pt.  

## 2012-09-10 NOTE — Telephone Encounter (Signed)
lmomtcb x 2  

## 2012-09-11 NOTE — Telephone Encounter (Signed)
Called, spoke with pt.  He would like to proceed with rescheduling the CT Chest.  Spoke with Okey Dupre who states pt will need a BMET - advised I will place order for this.   I spoke with pt again.  Advised to come to [facility] [place] office lab for lab prior to having CT.  He verbalized understanding of this.  I have transferred him to [facility] in [place] to schedule scan.    Note:  I spoke with Rose in [place] to make sure she did receive the pt when I transferred him.  Rose states she did talk with the patient who then informed her that he would like to wait until Monday to schedule this so he can look at his calander.  Rose states she will call pt back on Monday to try to reschedule this with pt.  IF he does not want to or she cannot get in touch with him, she will call us back.

## 2012-09-16 ENCOUNTER — Telehealth: Payer: Self-pay | Admitting: Internal Medicine

## 2012-09-16 NOTE — Telephone Encounter (Signed)
Please send a registered letter about it and close the case

## 2012-09-16 NOTE — Telephone Encounter (Signed)
Will forward to MR as an FYI 

## 2012-09-17 ENCOUNTER — Ambulatory Visit: Payer: BC Managed Care – PPO | Admitting: Internal Medicine

## 2012-09-17 ENCOUNTER — Other Ambulatory Visit (HOSPITAL_COMMUNITY): Payer: Self-pay | Admitting: Internal Medicine

## 2012-09-21 NOTE — Telephone Encounter (Signed)
Letter sent. [name], CMA  

## 2012-12-22 ENCOUNTER — Other Ambulatory Visit (HOSPITAL_COMMUNITY): Payer: Self-pay | Admitting: Internal Medicine

## 2013-06-16 ENCOUNTER — Encounter (HOSPITAL_COMMUNITY): Payer: Self-pay | Admitting: Emergency Medicine

## 2013-06-16 ENCOUNTER — Emergency Department (HOSPITAL_COMMUNITY)
Admission: EM | Admit: 2013-06-16 | Discharge: 2013-06-16 | Disposition: A | Discharge status: Home / Self Care | Payer: BC Managed Care – PPO | Attending: Emergency Medicine | Admitting: Emergency Medicine

## 2013-06-16 ENCOUNTER — Emergency Department (HOSPITAL_COMMUNITY): Payer: BC Managed Care – PPO

## 2013-06-16 DIAGNOSIS — W010XXA Fall on same level from slipping, tripping and stumbling without subsequent striking against object, initial encounter: Secondary | ICD-10-CM | POA: Insufficient documentation

## 2013-06-16 DIAGNOSIS — I509 Heart failure, unspecified: Secondary | ICD-10-CM | POA: Insufficient documentation

## 2013-06-16 DIAGNOSIS — S80211A Abrasion, right knee, initial encounter: Secondary | ICD-10-CM

## 2013-06-16 DIAGNOSIS — Z87891 Personal history of nicotine dependence: Secondary | ICD-10-CM | POA: Insufficient documentation

## 2013-06-16 DIAGNOSIS — Z79899 Other long term (current) drug therapy: Secondary | ICD-10-CM | POA: Insufficient documentation

## 2013-06-16 DIAGNOSIS — I2510 Atherosclerotic heart disease of native coronary artery without angina pectoris: Secondary | ICD-10-CM | POA: Insufficient documentation

## 2013-06-16 DIAGNOSIS — Y929 Unspecified place or not applicable: Secondary | ICD-10-CM | POA: Insufficient documentation

## 2013-06-16 DIAGNOSIS — Z8739 Personal history of other diseases of the musculoskeletal system and connective tissue: Secondary | ICD-10-CM | POA: Insufficient documentation

## 2013-06-16 DIAGNOSIS — S42401A Unspecified fracture of lower end of right humerus, initial encounter for closed fracture: Secondary | ICD-10-CM

## 2013-06-16 DIAGNOSIS — Y9301 Activity, walking, marching and hiking: Secondary | ICD-10-CM | POA: Insufficient documentation

## 2013-06-16 DIAGNOSIS — S42409A Unspecified fracture of lower end of unspecified humerus, initial encounter for closed fracture: Secondary | ICD-10-CM | POA: Insufficient documentation

## 2013-06-16 DIAGNOSIS — Z8679 Personal history of other diseases of the circulatory system: Secondary | ICD-10-CM | POA: Insufficient documentation

## 2013-06-16 DIAGNOSIS — IMO0002 Reserved for concepts with insufficient information to code with codable children: Secondary | ICD-10-CM | POA: Insufficient documentation

## 2013-06-16 DIAGNOSIS — Z85828 Personal history of other malignant neoplasm of skin: Secondary | ICD-10-CM | POA: Insufficient documentation

## 2013-06-16 DIAGNOSIS — E785 Hyperlipidemia, unspecified: Secondary | ICD-10-CM | POA: Insufficient documentation

## 2013-06-16 DIAGNOSIS — E119 Type 2 diabetes mellitus without complications: Secondary | ICD-10-CM | POA: Insufficient documentation

## 2013-06-16 DIAGNOSIS — E871 Hypo-osmolality and hyponatremia: Secondary | ICD-10-CM | POA: Insufficient documentation

## 2013-06-16 DIAGNOSIS — Z7982 Long term (current) use of aspirin: Secondary | ICD-10-CM | POA: Insufficient documentation

## 2013-06-16 DIAGNOSIS — I10 Essential (primary) hypertension: Secondary | ICD-10-CM | POA: Insufficient documentation

## 2013-06-16 LAB — CBC
HCT: 32.7 % — ABNORMAL LOW (ref 39.0–52.0)
Platelets: 176 10*3/uL (ref 150–400)
RBC: 3.62 MIL/uL — ABNORMAL LOW (ref 4.22–5.81)
RDW: 12 % (ref 11.5–15.5)
WBC: 12.3 10*3/uL — ABNORMAL HIGH (ref 4.0–10.5)

## 2013-06-16 LAB — BASIC METABOLIC PANEL
CO2: 24 mEq/L (ref 19–32)
Chloride: 87 mEq/L — ABNORMAL LOW (ref 96–112)
GFR calc Af Amer: >90 mL/min (ref >90)
Potassium: 4.2 mEq/L (ref 3.5–5.1)

## 2013-06-16 MED ORDER — ONDANSETRON HCL 4 MG/2ML IJ SOLN
4.0000 mg | Freq: Once | INTRAMUSCULAR | Status: AC
  Administered 2013-06-16: 4 mg via INTRAVENOUS
  Filled 2013-06-16: qty 2

## 2013-06-16 MED ORDER — HYDROMORPHONE HCL PF 1 MG/ML IJ SOLN
1.0000 mg | Freq: Once | INTRAMUSCULAR | Status: AC
  Administered 2013-06-16: 1 mg via INTRAVENOUS
  Filled 2013-06-16: qty 1

## 2013-06-16 MED ORDER — OXYCODONE-ACETAMINOPHEN 5-325 MG PO TABS: 1.0000 | ORAL_TABLET | ORAL | Status: DC | PRN

## 2013-06-16 MED ORDER — SODIUM CHLORIDE 0.9 % IV BOLUS (SEPSIS)
500.0000 mL | Freq: Once | INTRAVENOUS | Status: AC
  Administered 2013-06-16: 500 mL via INTRAVENOUS

## 2013-06-16 NOTE — ED Provider Notes (Signed)
History    This chart was scribed for non-physician practitioner, Lucretia Kern, working with Doug Sou, MD by Donne Anon, ED Scribe. This patient was seen in room TR11C/TR11C and the patient's care was started at 1528.  CSN: [id number] Arrival date & time 06/16/13  1511  First MD Initiated Contact with Patient 06/16/13 1528     Chief Complaint  Patient presents with  . Fall    The history is provided by the patient. No language interpreter was used.   HPI Comments: [name] is a 57 y.o. male brought in by ambulance, who presents to the Emergency Department complaining of a fall which occurred immediately PTA. He state he was walking and tripped and fell on his right elbow and right knee on cement. He denies hitting LOC or hitting hit head, and was ambulatory after the fall. He reports feeling dizzy as he was getting on the EMS stretcher. He reports scratches, swelling, and moderate pain to his right elbow and moderate scrapes to his right knee.  He reports he felt something move in his elbow when he fell. He is currently only experiencing mild elbow pain at rest, but severe pain with movement. Pt denies taking OTC medications at home to improve symptoms. He denies any shoulder or wrist pain. His tetanus shot is UTD.      Past Medical History  Diagnosis Date  . 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 Hypertension   . Arthritis   . Hyperlipidemia   . Diabetes mellitus     Newly diagnosed 12/2011 with A1C of 9.6  . PSVT (paroxysmal supraventricular tachycardia)   . CAD (coronary artery disease)     Newly diagnosed single-vessel CAD by cath 12/27/11 s/p BMS to RCA (95% eccentric lesion proximally, 30-40% distally)  . Systolic CHF     Diagnosed 12/2011 - possibly NICM felt secondary to hypertension and diabetes, given degree of LV dysfunction in the setting of single-vessel CAD  . Abnormal chest CT     Pulmonary nodules, right posterior chest wall soft tissue density  - recommended to f/u PCP   Past Surgical History  Procedure Laterality Date  . Hernia repair    . Tonsillectomy    . Vasectomy     Family History  Problem Relation Age of Onset  . Emphysema Mother   . Heart disease Brother   . Heart disease Maternal Grandmother   . Heart disease Paternal Grandfather   . Clotting disorder Brother   . Cancer Paternal Grandfather    History  Substance Use Topics  . Smoking status: Former Smoker -- 1.50 packs/day for 36 years    Types: Cigarettes    Quit date: 10/22/2002  . Smokeless tobacco: Never Used  . Alcohol Use: 14.4 oz/week    24 Cans of beer per week    Review of Systems  Musculoskeletal: Positive for joint swelling and arthralgias.  Skin: Positive for wound.  Neurological: Negative for syncope.  All other systems reviewed and are negative.    Allergies  Review of patient's allergies indicates no known allergies.  Home Medications   Current Outpatient Rx  Name  Route  Sig  Dispense  Refill  . ACCU-CHEK FASTCLIX LANCETS MISC      every other day.          Marland Kitchen aspirin EC 81 MG tablet   Oral   Take 81 mg by mouth daily.         Marland Kitchen  atorvastatin (LIPITOR) 80 MG tablet      TAKE ONE TABLET (80 MG TOTAL) BY MOUTH AT BEDTIME.   30 tablet   0     No refills available   . carvedilol (COREG) 25 MG tablet      TAKE ONE TABLET (25 MG TOTAL) BY MOUTH TWO TIMES DAILY WITH A MEAL.   60 tablet   1     CYCLE FILL MEDICATION. Authorization is required f ...   . furosemide (LASIX) 40 MG tablet   Oral   Take 1 tablet (40 mg total) by mouth daily.   30 tablet   1     Patient needs to make appointment    . glimepiride (AMARYL) 2 MG tablet   Oral   Take 3 mg by mouth daily before breakfast.          . linagliptin (TRADJENTA) 5 MG TABS tablet   Oral   Take 5 mg by mouth daily.         Marland Kitchen lisinopril (PRINIVIL,ZESTRIL) 20 MG tablet      TAKE 1 TABLET (20 MG TOTAL) BY MOUTH 2 (TWO) TIMES DAILY.   60 tablet   5      CYCLE FILL MEDICATION. Authorization is required f ...   . metFORMIN (GLUCOPHAGE) 1000 MG tablet   Oral   Take 1,000 mg by mouth daily with supper.         Marland Kitchen spironolactone (ALDACTONE) 25 MG tablet   Oral   Take 1 tablet (25 mg total) by mouth daily.   30 tablet   2   . EXPIRED: nitroGLYCERIN (NITROSTAT) 0.4 MG SL tablet   Sublingual   Place 1 tablet (0.4 mg total) under the tongue every 5 (five) minutes as needed for chest pain (up to 3 doses).   25 tablet   3    BP 158/86  Pulse 84  Temp(Src) 98.7 F (37.1 C) (Oral)  SpO2 97% Physical Exam  Nursing note and vitals reviewed. Constitutional: He appears well-developed and well-nourished. No distress.  HENT:  Head: Normocephalic and atraumatic.  Eyes: Conjunctivae are normal.  Neck: Neck supple. No tracheal deviation present.  Cardiovascular: Normal rate.   Pulmonary/Chest: Effort normal. No respiratory distress.  Musculoskeletal:       Right elbow: He exhibits decreased range of motion and swelling. Tenderness found.  Right elbow is swollen and tender to palpation. Good radial pulse. Good cap refill. Sensation intact. Shoulder normal. Full ROM of all fingers and wrist joint. Pt is able to touch each tip of finger with thumb. Able to to thumbs up. Able to spread all the fingers. Wrist non-tender. Shoulder non-tender.  Neurological: He is alert.  Skin: Skin is warm and dry.  Superficial abrasion noted to right knee measuring 4x4 cm.  Psychiatric: He has a normal mood and affect. His behavior is normal.    ED Course  Procedures (including critical care time) DIAGNOSTIC STUDIES: Oxygen Saturation is 97% on RA, adequate by my interpretation.    COORDINATION OF CARE: 4:28 PM Discussed treatment plan which includes waiting for xray results with pt at bedside and pt agreed to plan. Will give orthopedic referral.   4:39 PM Pt informed of results. Pt requesting pain medication at this time. Dilaudid and Zofran  ordered.  4:40 PM Case discussed with Dr. Ethelda Chick, who advised to consult with Dr. Mina Marble and obtain chest and wrist xray.  5:07 PM Case discussed with Dr. Mina Marble, who aggreed to evaluate pt.  5:08 PM Recheck pt and informed him that Dr. Mina Marble will see him.  5:41 PM Rechecked pt   Labs Reviewed - No data to display Dg Elbow Complete Right  06/16/2013   *RADIOLOGY REPORT*  Clinical Data: Right elbow pain post fall  RIGHT ELBOW - COMPLETE 3+ VIEW  Comparison: None  Findings: Humerus appears intact. Comminuted displaced intra-articular fracture of the proximal wall ulna with olecranon fragment rotated 45 degrees and distracted 3 cm. Comminuted displaced intra-articular fracture of the proximal right radius with a dominant displaced articular fragment of radial head noted. Associated soft tissue swelling and elbow joint effusion.  IMPRESSION: Comminuted displaced intra-articular fractures of the proximal left radius and ulna as above.   Original Report Authenticated By: Ulyses Southward, M.D.   1. Elbow fracture, right, closed, initial encounter   2. Abrasion of right knee, initial encounter   3. Hyponatremia     MDM  Pt with mechanical fall. Injury to right elbow. Fracture as described above. Dr. Mina Marble was consulted, pt seen by Dr. Mina Marble in ED. Advised posterior splint, sling, pain medications. He will see him tomorrow to take to OR. Pt is neurovascularly intact. Labs showed Na of 125, given IV fluids in ED. Pt informed. Pain controlled with IV dilaudid. Plan to d/c home with close follow up tomorrow morning.    Filed Vitals:   06/16/13 1536  BP: 158/86  Pulse: 84  Temp: 98.7 F (37.1 C)  TempSrc: Oral  SpO2: 97%     I personally performed the services described in this documentation, which was scribed in my presence. The recorded information has been reviewed and is accurate.    Lottie Mussel, PA-C 06/16/13 2320

## 2013-06-16 NOTE — ED Notes (Signed)
Paged ortho 

## 2013-06-16 NOTE — ED Notes (Signed)
Paged ortho tech 

## 2013-06-16 NOTE — Progress Notes (Signed)
Orthopedic Tech Progress Note Patient Details:  [name] July 13, 1956 [id number]  Ortho Devices Type of Ortho Device: Arm sling;Post (long arm) splint Ortho Device/Splint Location: RUE Ortho Device/Splint Interventions: Application;Ordered   Jennye Moccasin 06/16/2013, 6:24 PM

## 2013-06-16 NOTE — ED Notes (Signed)
Per EMS: pt c/o abrasions to right arm after falling today; swelling to right elbow; CMS intact

## 2013-06-16 NOTE — Consult Note (Signed)
Reason for Consult:right elbow pain Referring Physician: [name] is an 57 y.o. male.  HPI: s/p fall with displaced right proximal forearm fractures  Past Medical History  Diagnosis Date  . 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 Hypertension   . Arthritis   . Hyperlipidemia   . Diabetes mellitus     Newly diagnosed 12/2011 with A1C of 9.6  . PSVT (paroxysmal supraventricular tachycardia)   . CAD (coronary artery disease)     Newly diagnosed single-vessel CAD by cath 12/27/11 s/p BMS to RCA (95% eccentric lesion proximally, 30-40% distally)  . Systolic CHF     Diagnosed 12/2011 - possibly NICM felt secondary to hypertension and diabetes, given degree of LV dysfunction in the setting of single-vessel CAD  . Abnormal chest CT     Pulmonary nodules, right posterior chest wall soft tissue density - recommended to f/u PCP    Past Surgical History  Procedure Laterality Date  . Hernia repair    . Tonsillectomy    . Vasectomy      Family History  Problem Relation Age of Onset  . Emphysema Mother   . Heart disease Brother   . Heart disease Maternal Grandmother   . Heart disease Paternal Grandfather   . Clotting disorder Brother   . Cancer Paternal Grandfather     Social History:  reports that he quit smoking about 10 years ago. His smoking use included Cigarettes. He has a 54 pack-year smoking history. He has never used smokeless tobacco. He reports that he drinks about 14.4 ounces of alcohol per week. He reports that he does not use illicit drugs.  Allergies: No Known Allergies  Medications: Scheduled:  Results for orders placed during the hospital encounter of 06/16/13 (from the past 48 hour(s))  CBC     Status: Abnormal   Collection Time    06/16/13  4:56 PM      Result Value Range   WBC 12.3 (*) 4.0 - 10.5 K/uL   RBC 3.62 (*) 4.22 - 5.81 MIL/uL   Hemoglobin 11.9 (*) 13.0 - 17.0 g/dL   HCT 16.1 (*) 09.6 - 04.5 %   MCV 90.3  78.0 - 100.0 fL   MCH 32.9  26.0  - 34.0 pg   MCHC 36.4 (*) 30.0 - 36.0 g/dL   RDW 40.9  81.1 - 91.4 %   Platelets 176  150 - 400 K/uL  BASIC METABOLIC PANEL     Status: Abnormal   Collection Time    06/16/13  4:56 PM      Result Value Range   Sodium 125 (*) 135 - 145 mEq/L   Potassium 4.2  3.5 - 5.1 mEq/L   Chloride 87 (*) 96 - 112 mEq/L   CO2 24  19 - 32 mEq/L   Glucose, Bld 197 (*) 70 - 99 mg/dL   BUN 13  6 - 23 mg/dL   Creatinine, Ser 7.82  0.50 - 1.35 mg/dL   Calcium 9.6  8.4 - 95.6 mg/dL   GFR calc non Af Amer >90  >90 mL/min   GFR calc Af Amer >90  >90 mL/min   Comment:            The eGFR has been calculated     using the CKD EPI equation.     This calculation has not been     validated in all clinical     situations.     eGFR's persistently     <  90 mL/min signify     possible Chronic Kidney Disease.    Dg Chest 1 View  06/16/2013   *RADIOLOGY REPORT*  Clinical Data: Fall  CHEST - 1 VIEW  Comparison: 12/25/2011  Findings: COPD with hyperinflation and mild pulmonary scarring. Negative for pneumonia.  Cardiac enlargement without heart failure. No mass or effusion.  IMPRESSION: COPD.  No acute cardiopulmonary abnormality.   Original Report Authenticated By: Janeece Riggers, M.D.   Dg Elbow Complete Right  06/16/2013   *RADIOLOGY REPORT*  Clinical Data: Right elbow pain post fall  RIGHT ELBOW - COMPLETE 3+ VIEW  Comparison: None  Findings: Humerus appears intact. Comminuted displaced intra-articular fracture of the proximal wall ulna with olecranon fragment rotated 45 degrees and distracted 3 cm. Comminuted displaced intra-articular fracture of the proximal right radius with a dominant displaced articular fragment of radial head noted. Associated soft tissue swelling and elbow joint effusion.  IMPRESSION: Comminuted displaced intra-articular fractures of the proximal left radius and ulna as above.   Original Report Authenticated By: Ulyses Southward, M.D.   Dg Wrist Complete Right  06/16/2013   *RADIOLOGY REPORT*   Clinical Data: Fall  RIGHT WRIST - COMPLETE 3+ VIEW  Comparison: None  Findings: Negative for fracture.  Normal alignment.  Bone island in the distal radius appears to be benign  and nonaggressive.  No significant degenerative change.  IMPRESSION: No acute abnormality.   Original Report Authenticated By: Janeece Riggers, M.D.    Review of Systems  All other systems reviewed and are negative.   Blood pressure 158/86, pulse 84, temperature 98.7 F (37.1 C), temperature source Oral, SpO2 97.00%. Physical Exam  Constitutional: He is oriented to person, place, and time. He appears well-developed and well-nourished.  HENT:  Head: Normocephalic and atraumatic.  Cardiovascular: Normal rate.   Respiratory: Effort normal.  Musculoskeletal:       Right elbow: He exhibits swelling and deformity. Tenderness found. Radial head and olecranon process tenderness noted.  Complex right proximal foream fracture involving radius and ulna  Neurological: He is alert and oriented to person, place, and time.  Skin: Skin is warm.  Psychiatric: He has a normal mood and affect. His behavior is normal. Judgment and thought content normal.    Assessment/Plan: As above  Plan posterior splint with followup in my office tommorow to schedule ORIF  Emilyanne Mcgough A 06/16/2013, 6:43 PM

## 2013-06-17 NOTE — ED Provider Notes (Signed)
Medical screening examination/treatment/procedure(s) were performed by non-physician practitioner and as supervising physician I was immediately available for consultation/collaboration.  Doug Sou, MD 06/17/13 [phone]

## 2013-06-22 ENCOUNTER — Telehealth (HOSPITAL_COMMUNITY): Payer: Self-pay | Admitting: *Deleted

## 2013-06-22 NOTE — Telephone Encounter (Signed)
I don't feel comfortable clearing him.  I have not seen him in the past and he has not been seen in clinic for over 1 year.  Needs either clearance by Dr. Gala Romney or followup in clinic.

## 2013-06-22 NOTE — Telephone Encounter (Signed)
Pt called stating that he had broken his elbow and needed clearance for surgery that is scheduled tomorrow, will send to Dr Shirlee Latch for review, pt states he has been doing ok cardiac wise

## 2013-06-22 NOTE — Telephone Encounter (Signed)
Pt aware and will resch surgery till next week, will have Dr Gala Romney review tomorrow and get back to patient

## 2013-06-23 ENCOUNTER — Other Ambulatory Visit (HOSPITAL_COMMUNITY): Payer: Self-pay | Admitting: Internal Medicine

## 2013-06-23 ENCOUNTER — Encounter (HOSPITAL_BASED_OUTPATIENT_CLINIC_OR_DEPARTMENT_OTHER): Admission: RE | Payer: Self-pay | Source: Ambulatory Visit

## 2013-06-23 ENCOUNTER — Ambulatory Visit (HOSPITAL_COMMUNITY)
Admission: RE | Admit: 2013-06-23 | Discharge: 2013-06-23 | Disposition: A | Discharge status: Home / Self Care | Payer: BC Managed Care – PPO | Source: Ambulatory Visit | Attending: Internal Medicine | Admitting: Internal Medicine

## 2013-06-23 ENCOUNTER — Ambulatory Visit (HOSPITAL_BASED_OUTPATIENT_CLINIC_OR_DEPARTMENT_OTHER)
Admission: RE | Admit: 2013-06-23 | Payer: Worker's Compensation | Source: Ambulatory Visit | Admitting: Orthopedic Surgery

## 2013-06-23 ENCOUNTER — Encounter (HOSPITAL_COMMUNITY): Payer: Self-pay

## 2013-06-23 VITALS — BP 152/80 | HR 96 | Wt 266.1 lb

## 2013-06-23 DIAGNOSIS — I2510 Atherosclerotic heart disease of native coronary artery without angina pectoris: Secondary | ICD-10-CM | POA: Insufficient documentation

## 2013-06-23 DIAGNOSIS — Z01810 Encounter for preprocedural cardiovascular examination: Secondary | ICD-10-CM | POA: Insufficient documentation

## 2013-06-23 SURGERY — OPEN REDUCTION INTERNAL FIXATION (ORIF) ULNAR FRACTURE
Anesthesia: General | Laterality: Right

## 2013-06-23 NOTE — Assessment & Plan Note (Signed)
Doing very well from a cardiac standpoint. I feel he is at low-risk for any peri-operative cardiac complication. Can proceed to surgery without any further cardiac work-up. Discussed with Dr. Mina Marble by telephone.

## 2013-06-23 NOTE — Telephone Encounter (Signed)
Pt sch to see Dr Gala Romney today at 3:30

## 2013-06-23 NOTE — Progress Notes (Signed)
Patient ID: [name], male   DOB: May 08, 1956, 57 y.o.   MRN: [id number]    Referring Physician: Dr. Mina Marble Reason for Consultation: Pre-operative clearance   HPI:  Mr. Cordell is a 57 y.o. gentlemen with history of HTN and prediabetes. He presented to [facility] in January 2014 with dyspnea and chest pressure and found to have a NSTEMI and new onset heart failure. EF 20-25%. Underwent cardiac catheterization on 12/27/11 which demonstrated a 95% stenosis in the proximal RCA otherwise normal vessels, underwent PTCA with a BMS by Dr. [place] on 1/4 with recommendation for Plavix/ASA for at least 1 month.  Echo 6/13 EF 45-50%  Here for pre-operative clearance for repair of R elbow fracture. 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 Last week tripped over curb at work and sustained severe R elbow fx. BP has been well controlled (until the accident)  No SOB/orthopnea/PND.  No edema.     Review of Systems:     Cardiac Review of Systems: {Y] = yes [ ]  = no  Chest Pain [    ]  Resting SOB [   ] Exertional SOB  [  ]  Orthopnea [  ]   Pedal Edema [   ]    Palpitations [  ] Syncope  [  ]   Presyncope [   ]  General Review of Systems: [Y] = yes [  ]=no Constitional: recent weight change [  ]; anorexia [  ]; fatigue [  ]; nausea [  ]; night sweats [  ]; fever [  ]; or chills [  ];                                                                                                                                          Dental: poor dentition[  ];   Eye : blurred vision [  ]; diplopia [   ]; vision changes [  ];  Amaurosis fugax[  ]; Resp: cough [  ];  wheezing[  ];  hemoptysis[  ]; shortness of breath[  ]; paroxysmal nocturnal dyspnea[  ]; dyspnea on exertion[  ]; or orthopnea[  ];  GI:  gallstones[  ], vomiting[  ];  dysphagia[  ]; melena[  ];  hematochezia [  ]; heartburn[  ];   Hx of  Colonoscopy[  ]; GU: kidney stones [  ]; hematuria[  ];   dysuria [  ];  nocturia[  ];  history of      obstruction [  ];                 Skin: rash, swelling[  ];, hair loss[  ];  peripheral edema[  ];  or itching[  ]; Musculosketetal: myalgias[  ];  joint swelling[ y ];  joint erythema[  ];  joint pain[y  ];  back pain[  ];  Heme/Lymph: bruising[y  ];  bleeding[  ];  anemia[  ];  Neuro: TIA[  ];  headaches[  ];  stroke[  ];  vertigo[  ];  seizures[  ];   paresthesias[  ];  difficulty walking[  ];  Psych:depression[  ]; anxiety[  ];  Endocrine: diabetes[  ];  thyroid dysfunction[  ];  Immunizations: Flu [  ]; Pneumococcal[  ];  Other:  Past Medical History  Diagnosis Date  . 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 Hypertension   . Arthritis   . Hyperlipidemia   . Diabetes mellitus     Newly diagnosed 12/2011 with A1C of 9.6  . PSVT (paroxysmal supraventricular tachycardia)   . CAD (coronary artery disease)     Newly diagnosed single-vessel CAD by cath 12/27/11 s/p BMS to RCA (95% eccentric lesion proximally, 30-40% distally)  . Systolic CHF     Diagnosed 12/2011 - possibly NICM felt secondary to hypertension and diabetes, given degree of LV dysfunction in the setting of single-vessel CAD  . Abnormal chest CT     Pulmonary nodules, right posterior chest wall soft tissue density - recommended to f/u PCP     No Known Allergies  History   Social History  . Marital Status: Divorced    Spouse Name: Currently has girlfriend    Number of Children: N/A  . Years of Education: N/A   Occupational History  . IT      at [employer]    Social History Main Topics  . Smoking status: Former Smoker -- 1.50 packs/day for 36 years    Types: Cigarettes    Quit date: 10/22/2002  . Smokeless tobacco: Never Used  . Alcohol Use: 14.4 oz/week    24 Cans of beer per week  . Drug Use: No  . Sexually Active: Yes    Birth Control/ Protection: Surgical   Other Topics Concern  . Not on file   Social History Narrative  . No narrative on file    Family History  Problem Relation Age of Onset  .  Emphysema Mother   . Heart disease Brother   . Heart disease Maternal Grandmother   . Heart disease Paternal Grandfather   . Clotting disorder Brother   . Cancer Paternal Grandfather     PHYSICAL EXAM: Filed Vitals:   06/23/13 1548  BP: 152/80  Pulse: 96    No intake or output data in the 24 hours ending 06/23/13 1606  General:  Well appearing. No respiratory difficulty HEENT: normal Neck: supple. no JVD. Carotids 2+ bilat; no bruits. No lymphadenopathy or thryomegaly appreciated. Cor: PMI nondisplaced. Regular rate & rhythm. No rubs, gallops or murmurs. Lungs: clear Abdomen: obese. soft, nontender, nondistended. No hepatosplenomegaly. No bruits or masses. Good bowel sounds. Extremities: no cyanosis, clubbing, rash, edema. R s arm in sling. Diffuse swelling and ecchymosis of R bicep area Neuro: alert & oriented x 3, cranial nerves grossly intact. moves all 4 extremities w/o difficulty. Affect pleasant.  ECG: NSR 91 non-specific TW abnormalities inferolaterally (no change from previous)  No results found for this or any previous visit (from the past 24 hour(s)). No results found.   ASSESSMENT/PLAN:

## 2013-10-19 ENCOUNTER — Telehealth (HOSPITAL_COMMUNITY): Payer: Self-pay | Admitting: Cardiology

## 2013-10-19 NOTE — Telephone Encounter (Signed)
Attempting to schedule 3 month follow up 

## 2013-11-02 ENCOUNTER — Ambulatory Visit (HOSPITAL_COMMUNITY)
Admission: RE | Admit: 2013-11-02 | Discharge: 2013-11-02 | Disposition: A | Discharge status: Home / Self Care | Payer: BC Managed Care – PPO | Source: Ambulatory Visit | Attending: Internal Medicine | Admitting: Internal Medicine

## 2013-11-02 ENCOUNTER — Telehealth (HOSPITAL_COMMUNITY): Payer: Self-pay | Admitting: Adult Health

## 2013-11-02 VITALS — BP 136/74 | HR 99 | Wt 268.2 lb

## 2013-11-02 DIAGNOSIS — Z85820 Personal history of malignant melanoma of skin: Secondary | ICD-10-CM | POA: Insufficient documentation

## 2013-11-02 DIAGNOSIS — I10 Essential (primary) hypertension: Secondary | ICD-10-CM | POA: Insufficient documentation

## 2013-11-02 DIAGNOSIS — E119 Type 2 diabetes mellitus without complications: Secondary | ICD-10-CM | POA: Insufficient documentation

## 2013-11-02 DIAGNOSIS — I5022 Chronic systolic (congestive) heart failure: Secondary | ICD-10-CM | POA: Insufficient documentation

## 2013-11-02 DIAGNOSIS — R000 Tachycardia, unspecified: Secondary | ICD-10-CM | POA: Insufficient documentation

## 2013-11-02 DIAGNOSIS — Z96629 Presence of unspecified artificial elbow joint: Secondary | ICD-10-CM | POA: Insufficient documentation

## 2013-11-02 DIAGNOSIS — I509 Heart failure, unspecified: Secondary | ICD-10-CM | POA: Insufficient documentation

## 2013-11-02 DIAGNOSIS — R0789 Other chest pain: Secondary | ICD-10-CM | POA: Insufficient documentation

## 2013-11-02 DIAGNOSIS — E785 Hyperlipidemia, unspecified: Secondary | ICD-10-CM | POA: Insufficient documentation

## 2013-11-02 DIAGNOSIS — I2510 Atherosclerotic heart disease of native coronary artery without angina pectoris: Secondary | ICD-10-CM | POA: Insufficient documentation

## 2013-11-02 DIAGNOSIS — R918 Other nonspecific abnormal finding of lung field: Secondary | ICD-10-CM | POA: Insufficient documentation

## 2013-11-02 LAB — BASIC METABOLIC PANEL
CO2: 26 mEq/L (ref 19–32)
Chloride: 90 mEq/L — ABNORMAL LOW (ref 96–112)
Sodium: 127 mEq/L — ABNORMAL LOW (ref 135–145)

## 2013-11-02 MED ORDER — CARVEDILOL 25 MG PO TABS: 25.0000 mg | ORAL_TABLET | Freq: Two times a day (BID) | ORAL | Status: DC

## 2013-11-02 NOTE — Patient Instructions (Signed)
Follow up in 4 months with an ECHO  Take 25 mg carvedilol twice a day  Do the following things EVERYDAY: 1) Weigh yourself in the morning before breakfast. Write it down and keep it in a log. 2) Take your medicines as prescribed 3) Eat low salt foods-Limit salt (sodium) to 2000 mg per day.  4) Stay as active as you can everyday 5) Limit all fluids for the day to less than 2 liters

## 2013-11-02 NOTE — Telephone Encounter (Signed)
   Left message to return call regarding lab. Glucose elevated 345. Will fax to PCP, Dr Selena Batten.   [name] 4:22 PM

## 2013-11-02 NOTE — Progress Notes (Signed)
Patient ID: [name], male   DOB: 09/27/1956, 57 y.o.   MRN: [id number]  HPI: [name] is a 57 y.o. gentlemen with history of HTN and prediabetes.  He presented to [facility] with dyspnea and chest pressure and found to have a NSTEMI and new onset heart failure.  EF 20-25%.  Underwent cardiac catheterization on 12/27/11 which demonstrated a 95% stenosis in the proximal RCA otherwise normal vessels, underwent PTCA with a BMS by Dr. Swaziland on 1/4 with recommendation for Plavix/ASA for at least 1 month. 06/28/2013 S/P L elbow replacement.  ECHO 05/2012 EF 45-50%   He returns for follow up. Last visit lisinopril was increased to 20 mg twice a day. Denies SOB/PND/Orthopnea/CP . Weight at home. Not exercising but he continues with PT for strength training for the L elbow. Plans to return to work later this month. Not weighing.   PCP following lipids.   Labs 06/16/13 Na 125, K 4.2, Creatinine 0.68   Review of Systems: As in HPI  Past Medical History  Diagnosis Date  . 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 Hypertension   . Arthritis   . Hyperlipidemia   . Diabetes mellitus     Newly diagnosed 12/2011 with A1C of 9.6  . PSVT (paroxysmal supraventricular tachycardia)   . CAD (coronary artery disease)     Newly diagnosed single-vessel CAD by cath 12/27/11 s/p BMS to RCA (95% eccentric lesion proximally, 30-40% distally)  . Systolic CHF     Diagnosed 12/2011 - possibly NICM felt secondary to hypertension and diabetes, given degree of LV dysfunction in the setting of single-vessel CAD  . Abnormal chest CT     Pulmonary nodules, right posterior chest wall soft tissue density - recommended to f/u PCP    Current Outpatient Prescriptions  Medication Sig Dispense Refill  . ACCU-CHEK FASTCLIX LANCETS MISC every other day.       Marland Kitchen aspirin EC 81 MG tablet Take 81 mg by mouth daily.      Marland Kitchen atorvastatin (LIPITOR) 80 MG tablet TAKE ONE TABLET (80 MG TOTAL) BY MOUTH AT BEDTIME.  30 tablet  0  . carvedilol (COREG)  12.5 MG tablet Take 18.75 mg by mouth 2 (two) times daily with a meal.      . furosemide (LASIX) 40 MG tablet Take 1 tablet (40 mg total) by mouth daily.  30 tablet  1  . glimepiride (AMARYL) 2 MG tablet Take 3 mg by mouth daily before breakfast.       . lisinopril (PRINIVIL,ZESTRIL) 20 MG tablet TAKE 1 TABLET (20 MG TOTAL) BY MOUTH 2 (TWO) TIMES DAILY.  60 tablet  6  . metFORMIN (GLUCOPHAGE) 1000 MG tablet Take 1,000 mg by mouth daily with supper.      . nitroGLYCERIN (NITROSTAT) 0.4 MG SL tablet Place 1 tablet (0.4 mg total) under the tongue every 5 (five) minutes as needed for chest pain (up to 3 doses).  25 tablet  3  . spironolactone (ALDACTONE) 25 MG tablet Take 1 tablet (25 mg total) by mouth daily.  30 tablet  2   No current facility-administered medications for this encounter.    No Known Allergies   PHYSICAL EXAM: Filed Vitals:   11/02/13 1313  BP: 136/74  Pulse: 99  Weight: 268 lb 4 oz (121.677 kg)  SpO2: 100%    General:  Well appearing. No respiratory difficulty HEENT: normal Neck: supple. no JVD. Carotids 2+ bilat; no bruits. No lymphadenopathy or thryomegaly  appreciated. Cor: PMI nonpalpable.  Regular rate & rhythm. No rubs, gallops or murmurs. Lungs: clear Abdomen: obese. soft, nontender, nondistended. Good bowel sounds. Extremities: no cyanosis, clubbing, rash, edema Neuro: alert & oriented x 3, cranial nerves grossly intact. moves all 4 extremities w/o difficulty. Affect pleasant.   ASSESSMENT & PLAN: 1. Chronic Systolic Heart Failure: ECHO January 2013  20-25%> but improved to  45-50% in June 2013.  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  He had RCA disease with PCI but degree of LV dysfunction was out of proportion to CAD, suspect related to HTN/DM.  Volume status stable despite weight gain (weight gain likely related to decreased activity after elbow fracture). Continue lasix 40 mg daily. Continue spironolactone 25 mg daily Increase to  carvedilol  25 mg twice a day. Continue lisinopril 20 mg twice a day.  Check BMET today and every 3-4 months  2. CAD: 12/2011 RCA PTCA with a BMS by Dr. Swaziland on 12/27/2011.  Continue beta blocker, statin, and baby aspirin  3. Hyperlipidemia: Continue lipitor 80 mg daily. PCP following lipids. Will need Dr Selena Batten to fax results.   Follow up in 4 months with an ECHO  [name],[name] 1:30 PM  Patient seen with NP, agree with the above note.  Will increase Coreg to 25 mg bid and will get BMET today.  Will see him again in 4 months with echo to see if LV has recovered completely.   [name] 11/02/2013 1:46 PM   .

## 2013-11-04 NOTE — Telephone Encounter (Signed)
Pt aware.

## 2014-01-31 ENCOUNTER — Other Ambulatory Visit: Payer: Self-pay

## 2014-01-31 MED ORDER — LISINOPRIL 20 MG PO TABS: ORAL_TABLET | ORAL | Status: DC

## 2014-03-21 ENCOUNTER — Ambulatory Visit (HOSPITAL_COMMUNITY): Payer: BC Managed Care – PPO | Attending: Internal Medicine

## 2014-03-21 ENCOUNTER — Encounter (HOSPITAL_COMMUNITY): Payer: Self-pay

## 2014-05-04 ENCOUNTER — Other Ambulatory Visit (HOSPITAL_COMMUNITY): Payer: Self-pay

## 2014-05-04 MED ORDER — CARVEDILOL 25 MG PO TABS: 25.0000 mg | ORAL_TABLET | Freq: Two times a day (BID) | ORAL | Status: DC

## 2014-08-09 ENCOUNTER — Encounter (HOSPITAL_COMMUNITY): Payer: Self-pay

## 2014-08-09 ENCOUNTER — Ambulatory Visit (HOSPITAL_COMMUNITY): Admission: RE | Admit: 2014-08-09 | Payer: BC Managed Care – PPO | Source: Ambulatory Visit

## 2014-08-17 ENCOUNTER — Telehealth: Payer: Self-pay

## 2014-08-17 ENCOUNTER — Encounter (HOSPITAL_COMMUNITY): Payer: Self-pay | Admitting: *Deleted

## 2014-08-17 MED ORDER — LISINOPRIL 20 MG PO TABS: 20.0000 mg | ORAL_TABLET | Freq: Two times a day (BID) | ORAL | Status: DC

## 2014-08-17 NOTE — Telephone Encounter (Signed)
Pt has no showed for appt in March and Aug for f/u and echo, #60 with no refills sent to pharmacy, letter mailed to pt we will not refill without pt being seen

## 2014-11-23 ENCOUNTER — Other Ambulatory Visit: Payer: Self-pay | Admitting: Internal Medicine

## 2014-11-25 ENCOUNTER — Other Ambulatory Visit: Payer: Self-pay

## 2014-11-25 MED ORDER — LISINOPRIL 20 MG PO TABS: 20.0000 mg | ORAL_TABLET | Freq: Two times a day (BID) | ORAL | Status: DC

## 2014-11-26 ENCOUNTER — Other Ambulatory Visit (HOSPITAL_COMMUNITY): Payer: Self-pay | Admitting: Internal Medicine

## 2014-12-01 ENCOUNTER — Encounter (HOSPITAL_COMMUNITY): Payer: Self-pay | Admitting: Cardiology

## 2014-12-23 ENCOUNTER — Other Ambulatory Visit (HOSPITAL_COMMUNITY): Payer: Self-pay | Admitting: Internal Medicine

## 2014-12-28 ENCOUNTER — Telehealth (HOSPITAL_COMMUNITY): Payer: Self-pay | Admitting: Vascular Surgery

## 2014-12-28 ENCOUNTER — Other Ambulatory Visit (HOSPITAL_COMMUNITY): Payer: Self-pay

## 2014-12-28 ENCOUNTER — Other Ambulatory Visit (HOSPITAL_COMMUNITY): Payer: Self-pay | Admitting: Cardiology

## 2014-12-28 MED ORDER — CARVEDILOL 25 MG PO TABS: 25.0000 mg | ORAL_TABLET | Freq: Two times a day (BID) | ORAL | Status: DC

## 2014-12-28 NOTE — Telephone Encounter (Signed)
Chantel can you check on this please, looks to me like you just refilled his meds 1/4

## 2014-12-28 NOTE — Telephone Encounter (Signed)
Pt called for an appointment in Feb because he will not have insurance  til then , pt needs his medicine refilled Lisipril and Carvedilol, he said it was denied because he didn't have have an appt.[name] just called stating pt called them for his medicines because he has an appointment scheduled which he does not.. Please advise pt on what to do about his medications

## 2014-12-28 NOTE — Telephone Encounter (Signed)
Schedule unavailable to pt appointment date and time Advised call office back in early Feb for follow up, as we cannot continue to refill meds with out follow up.

## 2014-12-29 ENCOUNTER — Telehealth (HOSPITAL_COMMUNITY): Payer: Self-pay | Admitting: Vascular Surgery

## 2014-12-29 NOTE — Telephone Encounter (Signed)
Pharmacy called they got the prescription for the Carvadilol but not the Lisinopril

## 2015-01-02 MED ORDER — LISINOPRIL 20 MG PO TABS: 20.0000 mg | ORAL_TABLET | Freq: Two times a day (BID) | ORAL | Status: DC

## 2015-01-02 NOTE — Telephone Encounter (Signed)
Hesitant to continue refills until pt made appointment Pt unable to make appointment  Until insurance is effective in March Will refill x 2 pt must make follow up asap

## 2021-03-30 ENCOUNTER — Ambulatory Visit (INDEPENDENT_AMBULATORY_CARE_PROVIDER_SITE_OTHER): Payer: Medicare Other | Admitting: Podiatry

## 2021-03-30 ENCOUNTER — Encounter: Payer: Self-pay | Admitting: Podiatry

## 2021-03-30 ENCOUNTER — Other Ambulatory Visit: Payer: Self-pay

## 2021-03-30 DIAGNOSIS — E119 Type 2 diabetes mellitus without complications: Secondary | ICD-10-CM | POA: Diagnosis not present

## 2021-03-30 NOTE — Progress Notes (Addendum)
This patient returns to my office for at risk foot care.  This patient requires this care by a professional since this patient will be at risk due to having diabetes.  This patient presents to the office for diabetic foot exam.    This patient presents for at risk foot care today.  General Appearance  Alert, conversant and in no acute stress.  Vascular  Dorsalis pedis and posterior tibial  pulses are palpable  bilaterally.  Capillary return is within normal limits  bilaterally. Temperature is within normal limits  bilaterally.  Neurologic  Senn-Weinstein monofilament wire test diminished   bilaterally. Muscle power within normal limits bilaterally.  Nails Normal nails with no infection or drainage.. No evidence of bacterial infection or drainage bilaterally.  Orthopedic  No limitations of motion  feet .  No crepitus or effusions noted.  No bony pathology or digital deformities noted.  Skin  normotropic skin with no porokeratosis noted bilaterally.  No signs of infections or ulcers noted.   Diabetic neuropathy with no complications  Consent was obtained for treatment procedures.   No evidenece of vascular or neurologic pathology.    Return office visit   1 year.                  Told patient to return for periodic foot care and evaluation due to potential at risk complications.   Gardiner Barefoot DPM

## 2021-04-16 ENCOUNTER — Ambulatory Visit
Admission: RE | Admit: 2021-04-16 | Discharge: 2021-04-16 | Disposition: A | Discharge status: Home / Self Care | Payer: Medicare Other | Source: Ambulatory Visit | Attending: Family | Admitting: Family

## 2021-04-16 ENCOUNTER — Other Ambulatory Visit: Payer: Self-pay | Admitting: Family

## 2021-04-16 DIAGNOSIS — M25562 Pain in left knee: Secondary | ICD-10-CM

## 2021-04-16 DIAGNOSIS — M25552 Pain in left hip: Secondary | ICD-10-CM

## 2021-05-29 IMAGING — CR DG KNEE COMPLETE 4+V*L*
4 series · 4 of 4 positions shown · non-contrast
Comparison: None.

CLINICAL DATA: Chronic knee pain, worsening recently.

EXAM:
LEFT KNEE - COMPLETE 4+ VIEW

[t knee ap left]
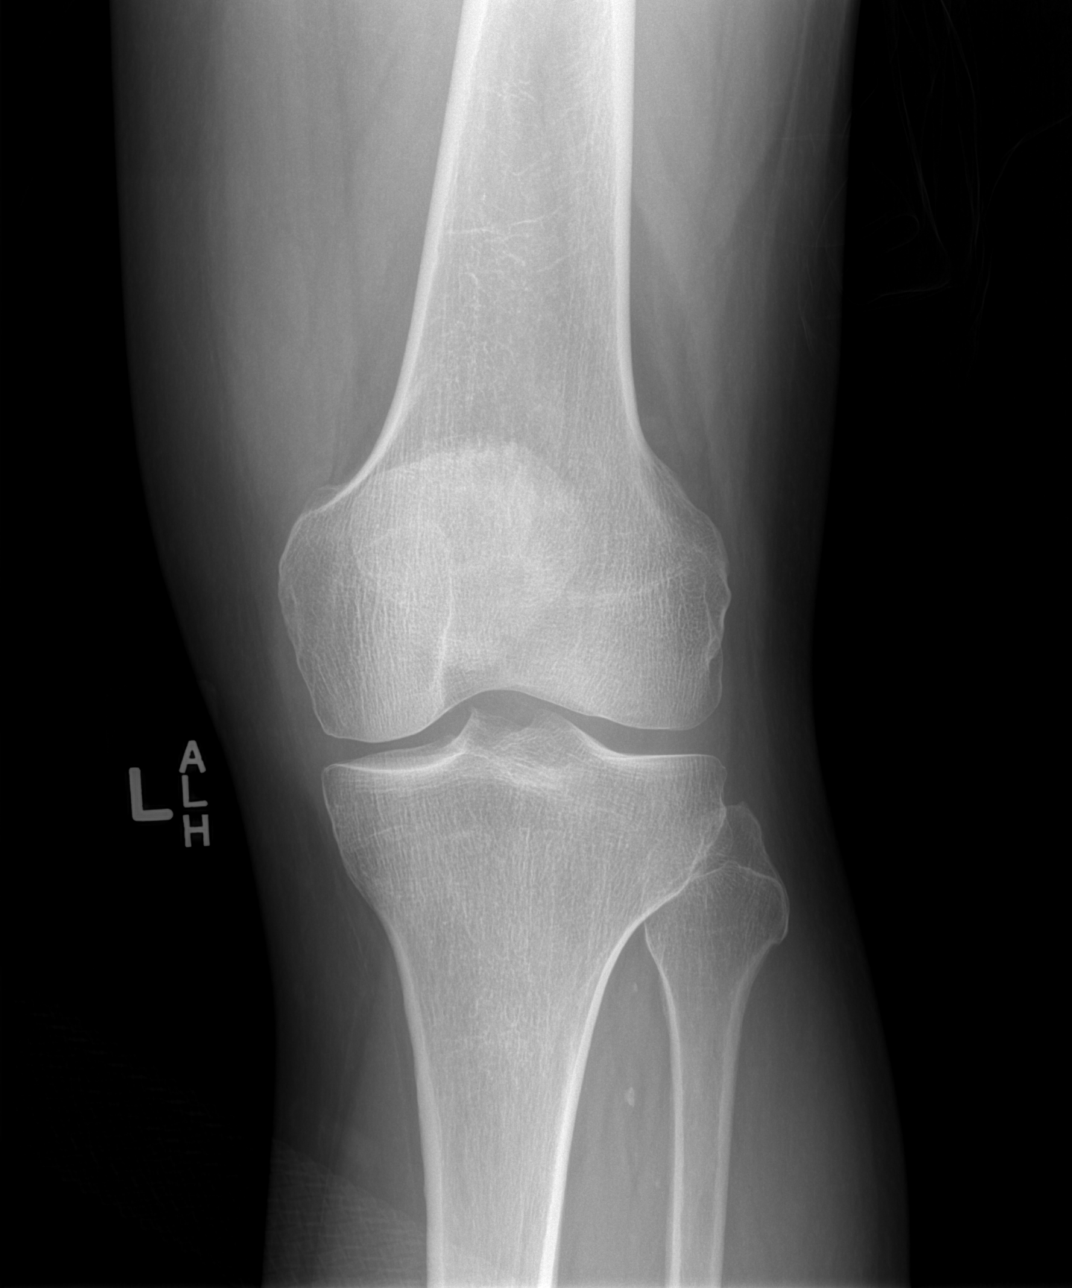

[t knee oblique left (1 of 2)]
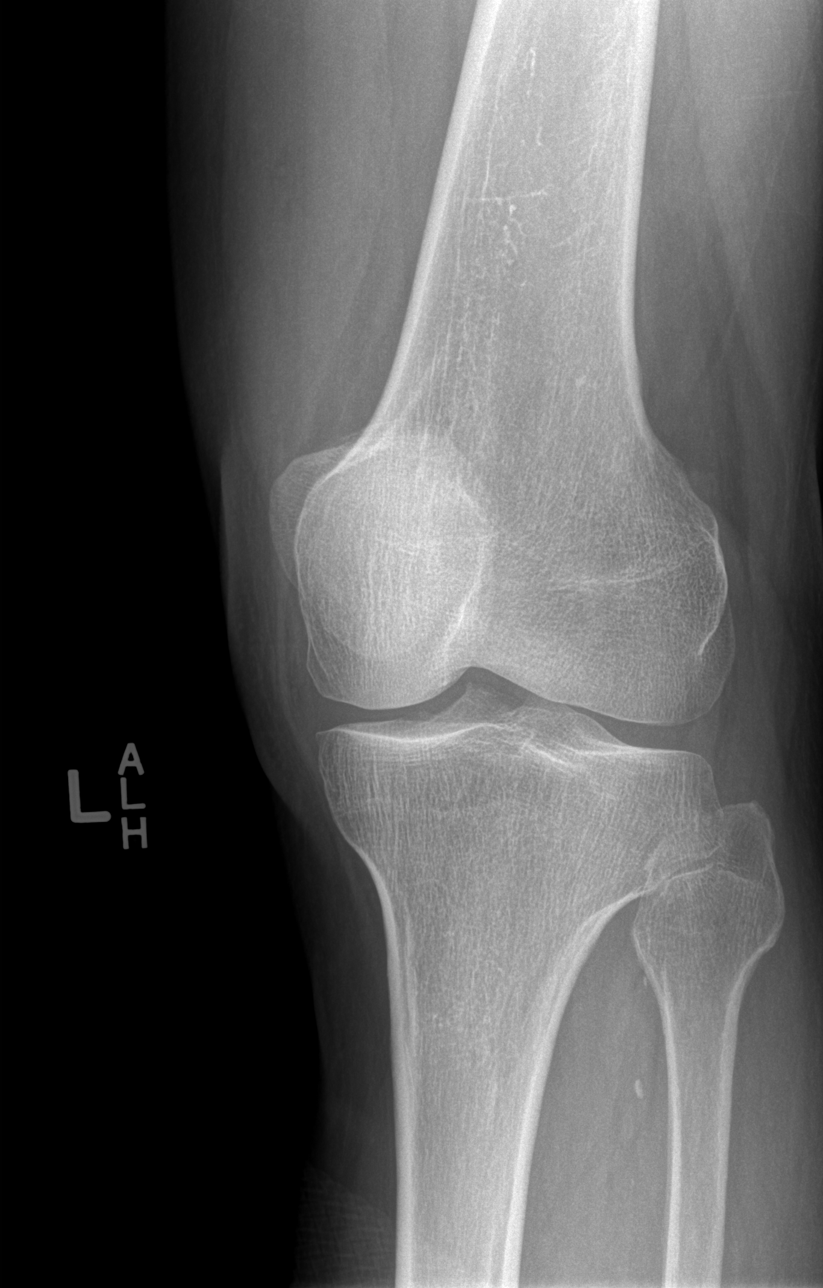

[t knee oblique left (2 of 2)]
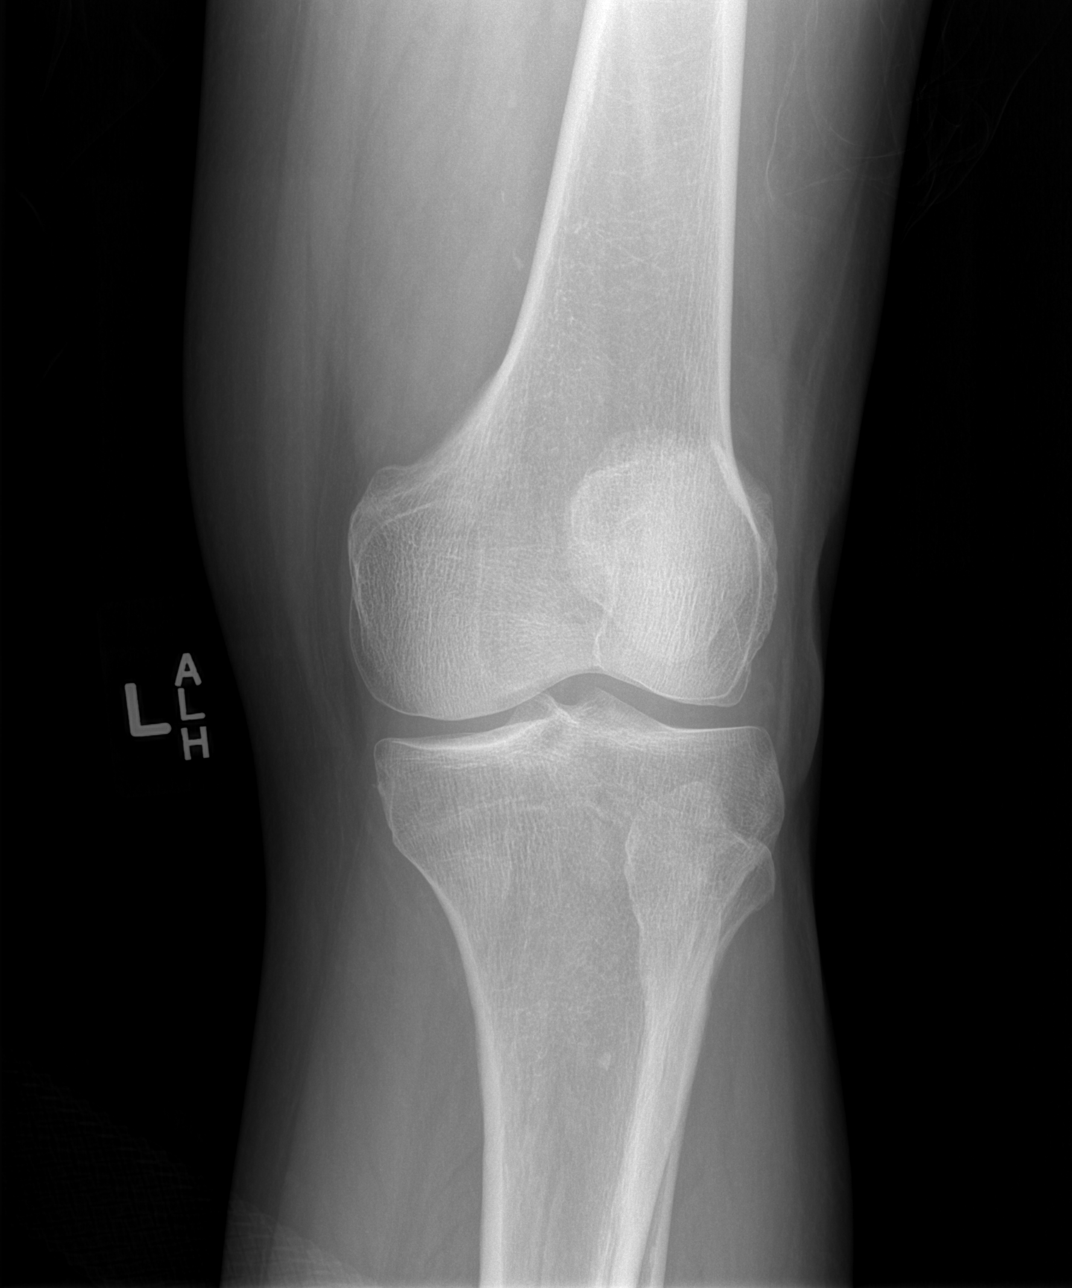

[t knee lat left]
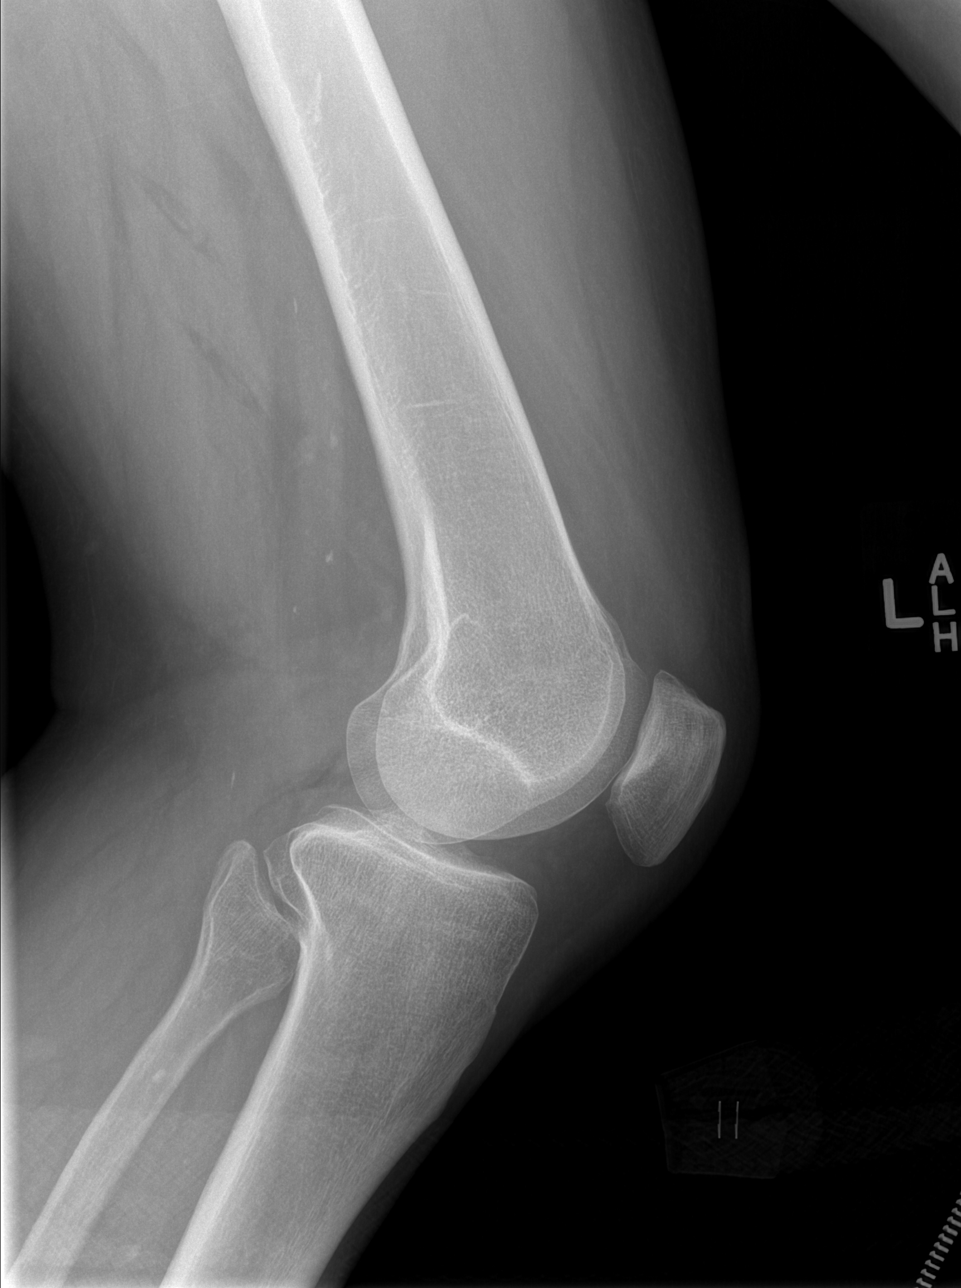

[4 of 4 positions shown; findings below may reference images not displayed]

FINDINGS: Possible tiny joint effusion. No joint space narrowing. No focal
bone finding. Regional arterial calcification incidentally noted.
IMPRESSION: Possible tiny joint effusion. No significant bone finding. No joint
space narrowing. Regional arterial calcification incidentally noted.

## 2022-04-04 ENCOUNTER — Ambulatory Visit: Payer: Medicare Other | Admitting: Podiatry

## 2023-10-28 ENCOUNTER — Inpatient Hospital Stay (HOSPITAL_COMMUNITY)
Admission: EM | Admit: 2023-10-28 | Discharge: 2023-11-04 | DRG: 330 | Disposition: A | Discharge status: Hospice | Payer: Medicare HMO | Attending: Internal Medicine | Admitting: Internal Medicine

## 2023-10-28 ENCOUNTER — Emergency Department (HOSPITAL_COMMUNITY): Payer: Medicare HMO

## 2023-10-28 ENCOUNTER — Other Ambulatory Visit: Payer: Self-pay

## 2023-10-28 DIAGNOSIS — Z955 Presence of coronary angioplasty implant and graft: Secondary | ICD-10-CM

## 2023-10-28 DIAGNOSIS — R627 Adult failure to thrive: Secondary | ICD-10-CM | POA: Diagnosis present

## 2023-10-28 DIAGNOSIS — I5022 Chronic systolic (congestive) heart failure: Secondary | ICD-10-CM | POA: Diagnosis present

## 2023-10-28 DIAGNOSIS — E876 Hypokalemia: Secondary | ICD-10-CM | POA: Diagnosis not present

## 2023-10-28 DIAGNOSIS — R188 Other ascites: Secondary | ICD-10-CM | POA: Diagnosis present

## 2023-10-28 DIAGNOSIS — K56609 Unspecified intestinal obstruction, unspecified as to partial versus complete obstruction: Principal | ICD-10-CM | POA: Diagnosis present

## 2023-10-28 DIAGNOSIS — Z515 Encounter for palliative care: Secondary | ICD-10-CM

## 2023-10-28 DIAGNOSIS — Z7982 Long term (current) use of aspirin: Secondary | ICD-10-CM

## 2023-10-28 DIAGNOSIS — E785 Hyperlipidemia, unspecified: Secondary | ICD-10-CM | POA: Diagnosis present

## 2023-10-28 DIAGNOSIS — K6389 Other specified diseases of intestine: Secondary | ICD-10-CM

## 2023-10-28 DIAGNOSIS — Z87891 Personal history of nicotine dependence: Secondary | ICD-10-CM

## 2023-10-28 DIAGNOSIS — Z8249 Family history of ischemic heart disease and other diseases of the circulatory system: Secondary | ICD-10-CM

## 2023-10-28 DIAGNOSIS — I2510 Atherosclerotic heart disease of native coronary artery without angina pectoris: Secondary | ICD-10-CM | POA: Diagnosis present

## 2023-10-28 DIAGNOSIS — Z79899 Other long term (current) drug therapy: Secondary | ICD-10-CM

## 2023-10-28 DIAGNOSIS — K56699 Other intestinal obstruction unspecified as to partial versus complete obstruction: Secondary | ICD-10-CM | POA: Diagnosis present

## 2023-10-28 DIAGNOSIS — Z825 Family history of asthma and other chronic lower respiratory diseases: Secondary | ICD-10-CM

## 2023-10-28 DIAGNOSIS — C183 Malignant neoplasm of hepatic flexure: Secondary | ICD-10-CM | POA: Diagnosis present

## 2023-10-28 DIAGNOSIS — E669 Obesity, unspecified: Secondary | ICD-10-CM | POA: Diagnosis present

## 2023-10-28 DIAGNOSIS — C787 Secondary malignant neoplasm of liver and intrahepatic bile duct: Secondary | ICD-10-CM | POA: Diagnosis present

## 2023-10-28 DIAGNOSIS — Z85828 Personal history of other malignant neoplasm of skin: Secondary | ICD-10-CM

## 2023-10-28 DIAGNOSIS — C786 Secondary malignant neoplasm of retroperitoneum and peritoneum: Principal | ICD-10-CM | POA: Diagnosis present

## 2023-10-28 DIAGNOSIS — E222 Syndrome of inappropriate secretion of antidiuretic hormone: Secondary | ICD-10-CM | POA: Diagnosis not present

## 2023-10-28 DIAGNOSIS — E1165 Type 2 diabetes mellitus with hyperglycemia: Secondary | ICD-10-CM | POA: Diagnosis present

## 2023-10-28 DIAGNOSIS — E872 Acidosis, unspecified: Secondary | ICD-10-CM | POA: Diagnosis not present

## 2023-10-28 DIAGNOSIS — E861 Hypovolemia: Secondary | ICD-10-CM | POA: Diagnosis present

## 2023-10-28 DIAGNOSIS — C189 Malignant neoplasm of colon, unspecified: Secondary | ICD-10-CM

## 2023-10-28 DIAGNOSIS — D630 Anemia in neoplastic disease: Secondary | ICD-10-CM | POA: Diagnosis present

## 2023-10-28 DIAGNOSIS — Z6834 Body mass index (BMI) 34.0-34.9, adult: Secondary | ICD-10-CM

## 2023-10-28 DIAGNOSIS — I110 Hypertensive heart disease with heart failure: Secondary | ICD-10-CM | POA: Diagnosis present

## 2023-10-28 DIAGNOSIS — Z66 Do not resuscitate: Secondary | ICD-10-CM | POA: Diagnosis present

## 2023-10-28 DIAGNOSIS — Z7984 Long term (current) use of oral hypoglycemic drugs: Secondary | ICD-10-CM

## 2023-10-28 DIAGNOSIS — Z832 Family history of diseases of the blood and blood-forming organs and certain disorders involving the immune mechanism: Secondary | ICD-10-CM

## 2023-10-28 LAB — CBC
HCT: 25.7 % — ABNORMAL LOW (ref 39.0–52.0)
Hemoglobin: 8.7 g/dL — ABNORMAL LOW (ref 13.0–17.0)
MCH: 30.9 pg (ref 26.0–34.0)
MCHC: 33.9 g/dL (ref 30.0–36.0)
MCV: 91.1 fL (ref 80.0–100.0)
Platelets: 224 10*3/uL (ref 150–400)
RBC: 2.82 MIL/uL — ABNORMAL LOW (ref 4.22–5.81)
RDW: 12.5 % (ref 11.5–15.5)
WBC: 5.1 10*3/uL (ref 4.0–10.5)
nRBC: 0 % (ref 0.0–0.2)

## 2023-10-28 LAB — COMPREHENSIVE METABOLIC PANEL
ALT: 27 U/L (ref 0–44)
AST: 26 U/L (ref 15–41)
Albumin: 3.5 g/dL (ref 3.5–5.0)
Alkaline Phosphatase: 78 U/L (ref 38–126)
Anion gap: 11 (ref 5–15)
BUN: 44 mg/dL — ABNORMAL HIGH (ref 8–23)
CO2: 22 mmol/L (ref 22–32)
Calcium: 9.3 mg/dL (ref 8.9–10.3)
Chloride: 94 mmol/L — ABNORMAL LOW (ref 98–111)
Creatinine, Ser: 1 mg/dL (ref 0.61–1.24)
GFR, Estimated: >60 mL/min (ref >60)
Glucose, Bld: 135 mg/dL — ABNORMAL HIGH (ref 70–99)
Potassium: 3.8 mmol/L (ref 3.5–5.1)
Sodium: 127 mmol/L — ABNORMAL LOW (ref 135–145)
Total Bilirubin: 1.5 mg/dL — ABNORMAL HIGH (ref <1.2)
Total Protein: 7.8 g/dL (ref 6.5–8.1)

## 2023-10-28 LAB — LIPASE, BLOOD: Lipase: 31 U/L (ref 11–51)

## 2023-10-28 MED ORDER — LACTATED RINGERS IV BOLUS
1000.0000 mL | Freq: Once | INTRAVENOUS | Status: AC
  Administered 2023-10-28: 1000 mL via INTRAVENOUS

## 2023-10-28 MED ORDER — IOHEXOL 300 MG/ML  SOLN
100.0000 mL | Freq: Once | INTRAMUSCULAR | Status: AC | PRN
  Administered 2023-10-28: 100 mL via INTRAVENOUS

## 2023-10-28 NOTE — ED Provider Notes (Signed)
[place] EMERGENCY DEPARTMENT AT [facility] Provider Note   CSN: [id number] Arrival date & time: 10/28/23  1600     History {Add pertinent medical, surgical, social history, OB history to HPI:1} Chief Complaint  Patient presents with  . Abdominal Pain  . Diarrhea  . Fatigue    [name] is a 68 y.o. male.   Abdominal Pain Associated symptoms: diarrhea   Diarrhea Associated symptoms: abdominal pain   67 year old male history of CAD, diabetes, hypertension, hyperlipidemia presenting for abdominal pain, vomiting, unintentional weight loss.  Patient is here with his wife.  He states for last 2 weeks has had lower abdominal pain worse in the left lower quadrant.  Is a chronic nonbloody nonmelanotic diarrhea.  Has had some difficulty walking, generalized fatigue.  Also had couple episodes of nausea and vomiting.  Wife thinks he is jaundiced.  He was seen by GI today after being referred by his PCP.  She was concerned given unintentional weight loss and lower abdominal pain that he could have some sort of mass or new malignancy causing his symptoms.  Is not any chest pain or shortness of breath.  No cough or fevers.  No headache or neurologic changes.     Home Medications Prior to Admission medications   Medication Sig Start Date End Date Taking? Authorizing Provider  ACCU-CHEK FASTCLIX LANCETS MISC every other day.  06/03/12   [provider]  aspirin EC 81 MG tablet Take 81 mg by mouth daily.    [provider]  atorvastatin (LIPITOR) 80 MG tablet TAKE ONE TABLET (80 MG TOTAL) BY MOUTH AT BEDTIME. 09/17/12   Bensimhon, Bevelyn Buckles, MD  carvedilol (COREG) 25 MG tablet Take 1 tablet (25 mg total) by mouth 2 (two) times daily with a meal. 12/28/14   Bensimhon, Bevelyn Buckles, MD  furosemide (LASIX) 40 MG tablet Take 1 tablet (40 mg total) by mouth daily. 03/30/12 11/02/14  Duke Salvia, MD  glimepiride (AMARYL) 2 MG tablet Take 3 mg by mouth daily before breakfast.   12/31/11 11/02/14  Dunn, Kriste Basque N, PA-C  lisinopril (ZESTRIL) 2.5 MG tablet Take by mouth. 03/07/21   [provider]  metFORMIN (GLUCOPHAGE) 1000 MG tablet Take 1,000 mg by mouth daily with supper.    [provider]  Multiple Vitamin (MULTIVITAMIN) capsule Take 1 capsule by mouth daily.    [provider]  nitroGLYCERIN (NITROSTAT) 0.4 MG SL tablet Place 1 tablet (0.4 mg total) under the tongue every 5 (five) minutes as needed for chest pain (up to 3 doses). 12/31/11 11/02/14  Dunn, Tacey Ruiz, PA-C  Omega-3 Fatty Acids (FISH OIL PO) Take by mouth.    [provider]  OZEMPIC, 0.25 OR 0.5 MG/DOSE, 2 MG/1.5ML SOPN Inject into the skin. 03/25/21   [provider]  spironolactone (ALDACTONE) 25 MG tablet Take 1 tablet (25 mg total) by mouth daily. 12/31/11   Laurann Montana, PA-C      Allergies    Patient has no known allergies.    Review of Systems   Review of Systems  Gastrointestinal:  Positive for abdominal pain and diarrhea.  Review of systems completed and notable as per HPI.  ROS otherwise negative.   Physical Exam Updated Vital Signs BP 117/61 (BP Location: Left Arm)   Pulse 89   Temp 98 F (36.7 C) (Oral)   Resp 16   Ht 5\' 10"  (1.778 m)   Wt 104.3 kg   SpO2 97%   BMI  33.00 kg/m  Physical Exam Vitals and nursing note reviewed.  Constitutional:      General: He is not in acute distress.    Appearance: He is well-developed.  HENT:     Head: Normocephalic and atraumatic.     Nose: Nose normal.     Mouth/Throat:     Mouth: Mucous membranes are moist.     Pharynx: Oropharynx is clear.  Eyes:     Extraocular Movements: Extraocular movements intact.     Conjunctiva/sclera: Conjunctivae normal.     Pupils: Pupils are equal, round, and reactive to light.  Cardiovascular:     Rate and Rhythm: Normal rate and regular rhythm.     Pulses: Normal pulses.     Heart sounds: Normal heart sounds. No murmur heard. Pulmonary:     Effort: Pulmonary  effort is normal. No respiratory distress.     Breath sounds: Normal breath sounds.  Abdominal:     Palpations: Abdomen is soft.     Tenderness: There is abdominal tenderness. There is no guarding or rebound.     Comments: Tenderness in the lower abdomen worse on the left lower quadrant.  Musculoskeletal:        General: No swelling.     Cervical back: Neck supple.     Right lower leg: No edema.     Left lower leg: No edema.  Skin:    General: Skin is warm and dry.     Capillary Refill: Capillary refill takes less than 2 seconds.     Coloration: Skin is pale.  Neurological:     General: No focal deficit present.     Mental Status: He is alert and oriented to person, place, and time. Mental status is at baseline.  Psychiatric:        Mood and Affect: Mood normal.     ED Results / Procedures / Treatments   Labs (all labs ordered are listed, but only abnormal results are displayed) Labs Reviewed  COMPREHENSIVE METABOLIC PANEL - Abnormal; Notable for the following components:      Result Value   Sodium 127 (*)    Chloride 94 (*)    Glucose, Bld 135 (*)    BUN 44 (*)    Total Bilirubin 1.5 (*)    All other components within normal limits  CBC - Abnormal; Notable for the following components:   RBC 2.82 (*)    Hemoglobin 8.7 (*)    HCT 25.7 (*)    All other components within normal limits  LIPASE, BLOOD  URINALYSIS, ROUTINE W REFLEX MICROSCOPIC    EKG None  Radiology No results found.  Procedures Procedures  {Document cardiac monitor, telemetry assessment procedure when appropriate:1}  Medications Ordered in ED Medications - No data to display  ED Course/ Medical Decision Making/ A&P Clinical Course as of 10/28/23 2243  Tue Oct 28, 2023  2241 EKG with borderline T wave abnormalities in the inferior leads although appears similar to prior EKG [JD]    Clinical Course User Index [JD] Laurence Spates, MD   {   Click here for ABCD2, HEART and other  calculatorsREFRESH Note before signing :1}                              Medical Decision Making Amount and/or Complexity of Data Reviewed Labs: ordered. Radiology: ordered.  Risk Prescription drug management.   Medical Decision Making:   [name] is a  67 y.o. male who presented to the ED today with fatigue, weight loss, vomiting, diarrhea, abdominal pain.  No signs here unremarkable though was noted to be borderline hypotensive at GI clinic.  He was sent here due to concern for failure to thrive, possible malignancy.  He does appear somewhat pale, reports unintentional weight loss, failure to thrive, vomiting, left lower quadrant pain.  He is mildly tender on exam.  Obtained obtain CT of the chest abdomen pelvis to evaluate possible intra-abdominal mass, infection, diverticulitis or other cause of his symptoms.  No chest pain, lower concern for ACS.  Sodium is low but appears chronically so.  BUN is somewhat up.  He has significant anemia, no evidence of GI bleeding but do not have recent hemoglobin to compare to.   {crccomplexity:27900} Reviewed and confirmed nursing documentation for past medical history, family history, social history.  Reassessment and Plan:   ***    Patient's presentation is most consistent with {EM COPA:27473}     {Document critical care time when appropriate:1} {Document review of labs and clinical decision tools ie heart score, Chads2Vasc2 etc:1}  {Document your independent review of radiology images, and any outside records:1} {Document your discussion with family members, caretakers, and with consultants:1} {Document social determinants of health affecting pt's care:1} {Document your decision making why or why not admission, treatments were needed:1} Final Clinical Impression(s) / ED Diagnoses Final diagnoses:  None    Rx / DC Orders ED Discharge Orders     None

## 2023-10-28 NOTE — Progress Notes (Incomplete)
Consult

## 2023-10-28 NOTE — ED Triage Notes (Signed)
Pt arrived via POV. C/o intermittent gen abd pain and diarrhea for 3x weeks. Has seen PCP, and a GI doc- [facility] referred him here.  AOx4

## 2023-10-28 NOTE — ED Notes (Signed)
Pts wife states that they are leaving.

## 2023-10-29 ENCOUNTER — Encounter (HOSPITAL_COMMUNITY): Payer: Self-pay | Admitting: Internal Medicine

## 2023-10-29 DIAGNOSIS — Z515 Encounter for palliative care: Secondary | ICD-10-CM | POA: Diagnosis not present

## 2023-10-29 DIAGNOSIS — E871 Hypo-osmolality and hyponatremia: Secondary | ICD-10-CM | POA: Diagnosis not present

## 2023-10-29 DIAGNOSIS — Z7189 Other specified counseling: Secondary | ICD-10-CM | POA: Diagnosis not present

## 2023-10-29 DIAGNOSIS — K56601 Complete intestinal obstruction, unspecified as to cause: Secondary | ICD-10-CM | POA: Diagnosis not present

## 2023-10-29 DIAGNOSIS — Z66 Do not resuscitate: Secondary | ICD-10-CM | POA: Diagnosis present

## 2023-10-29 DIAGNOSIS — K56609 Unspecified intestinal obstruction, unspecified as to partial versus complete obstruction: Principal | ICD-10-CM

## 2023-10-29 DIAGNOSIS — E785 Hyperlipidemia, unspecified: Secondary | ICD-10-CM | POA: Diagnosis present

## 2023-10-29 DIAGNOSIS — E876 Hypokalemia: Secondary | ICD-10-CM | POA: Diagnosis not present

## 2023-10-29 DIAGNOSIS — C189 Malignant neoplasm of colon, unspecified: Secondary | ICD-10-CM | POA: Diagnosis not present

## 2023-10-29 DIAGNOSIS — E872 Acidosis, unspecified: Secondary | ICD-10-CM | POA: Diagnosis not present

## 2023-10-29 DIAGNOSIS — Z8249 Family history of ischemic heart disease and other diseases of the circulatory system: Secondary | ICD-10-CM | POA: Diagnosis not present

## 2023-10-29 DIAGNOSIS — E669 Obesity, unspecified: Secondary | ICD-10-CM | POA: Diagnosis present

## 2023-10-29 DIAGNOSIS — E1165 Type 2 diabetes mellitus with hyperglycemia: Secondary | ICD-10-CM | POA: Diagnosis present

## 2023-10-29 DIAGNOSIS — I2510 Atherosclerotic heart disease of native coronary artery without angina pectoris: Secondary | ICD-10-CM | POA: Diagnosis present

## 2023-10-29 DIAGNOSIS — D649 Anemia, unspecified: Secondary | ICD-10-CM | POA: Diagnosis not present

## 2023-10-29 DIAGNOSIS — Z85828 Personal history of other malignant neoplasm of skin: Secondary | ICD-10-CM | POA: Diagnosis not present

## 2023-10-29 DIAGNOSIS — I110 Hypertensive heart disease with heart failure: Secondary | ICD-10-CM | POA: Diagnosis present

## 2023-10-29 DIAGNOSIS — R188 Other ascites: Secondary | ICD-10-CM | POA: Diagnosis present

## 2023-10-29 DIAGNOSIS — Z7984 Long term (current) use of oral hypoglycemic drugs: Secondary | ICD-10-CM | POA: Diagnosis not present

## 2023-10-29 DIAGNOSIS — E222 Syndrome of inappropriate secretion of antidiuretic hormone: Secondary | ICD-10-CM | POA: Diagnosis not present

## 2023-10-29 DIAGNOSIS — E861 Hypovolemia: Secondary | ICD-10-CM | POA: Diagnosis present

## 2023-10-29 DIAGNOSIS — C183 Malignant neoplasm of hepatic flexure: Secondary | ICD-10-CM | POA: Diagnosis present

## 2023-10-29 DIAGNOSIS — Z6834 Body mass index (BMI) 34.0-34.9, adult: Secondary | ICD-10-CM | POA: Diagnosis not present

## 2023-10-29 DIAGNOSIS — C786 Secondary malignant neoplasm of retroperitoneum and peritoneum: Secondary | ICD-10-CM | POA: Diagnosis present

## 2023-10-29 DIAGNOSIS — K56699 Other intestinal obstruction unspecified as to partial versus complete obstruction: Secondary | ICD-10-CM | POA: Diagnosis present

## 2023-10-29 DIAGNOSIS — K6389 Other specified diseases of intestine: Secondary | ICD-10-CM | POA: Diagnosis not present

## 2023-10-29 DIAGNOSIS — C787 Secondary malignant neoplasm of liver and intrahepatic bile duct: Secondary | ICD-10-CM | POA: Diagnosis present

## 2023-10-29 DIAGNOSIS — R627 Adult failure to thrive: Secondary | ICD-10-CM | POA: Diagnosis present

## 2023-10-29 DIAGNOSIS — D630 Anemia in neoplastic disease: Secondary | ICD-10-CM | POA: Diagnosis present

## 2023-10-29 DIAGNOSIS — I5022 Chronic systolic (congestive) heart failure: Secondary | ICD-10-CM | POA: Diagnosis present

## 2023-10-29 LAB — CBG MONITORING, ED
Glucose-Capillary: 100 mg/dL — ABNORMAL HIGH (ref 70–99)
Glucose-Capillary: 106 mg/dL — ABNORMAL HIGH (ref 70–99)

## 2023-10-29 LAB — TROPONIN I (HIGH SENSITIVITY): Troponin I (High Sensitivity): 17 ng/L (ref <18)

## 2023-10-29 LAB — BASIC METABOLIC PANEL
Anion gap: 10 (ref 5–15)
BUN: 38 mg/dL — ABNORMAL HIGH (ref 8–23)
CO2: 19 mmol/L — ABNORMAL LOW (ref 22–32)
Calcium: 8.8 mg/dL — ABNORMAL LOW (ref 8.9–10.3)
Chloride: 98 mmol/L (ref 98–111)
Creatinine, Ser: 0.87 mg/dL (ref 0.61–1.24)
GFR, Estimated: >60 mL/min (ref >60)
Glucose, Bld: 110 mg/dL — ABNORMAL HIGH (ref 70–99)
Potassium: 3.5 mmol/L (ref 3.5–5.1)
Sodium: 127 mmol/L — ABNORMAL LOW (ref 135–145)

## 2023-10-29 LAB — CBC
HCT: 22.1 % — ABNORMAL LOW (ref 39.0–52.0)
Hemoglobin: 7.6 g/dL — ABNORMAL LOW (ref 13.0–17.0)
MCH: 31.1 pg (ref 26.0–34.0)
MCHC: 34.4 g/dL (ref 30.0–36.0)
MCV: 90.6 fL (ref 80.0–100.0)
Platelets: 187 10*3/uL (ref 150–400)
RBC: 2.44 MIL/uL — ABNORMAL LOW (ref 4.22–5.81)
RDW: 12.6 % (ref 11.5–15.5)
WBC: 3.3 10*3/uL — ABNORMAL LOW (ref 4.0–10.5)
nRBC: 0 % (ref 0.0–0.2)

## 2023-10-29 LAB — PHOSPHORUS: Phosphorus: 2.8 mg/dL (ref 2.5–4.6)

## 2023-10-29 LAB — URINALYSIS, ROUTINE W REFLEX MICROSCOPIC
Bilirubin Urine: NEGATIVE
Glucose, UA: NEGATIVE mg/dL
Hgb urine dipstick: NEGATIVE
Ketones, ur: 5 mg/dL — AB
Leukocytes,Ua: NEGATIVE
Nitrite: NEGATIVE
Protein, ur: NEGATIVE mg/dL
Specific Gravity, Urine: 1.041 — ABNORMAL HIGH (ref 1.005–1.030)
pH: 5 (ref 5.0–8.0)

## 2023-10-29 LAB — GLUCOSE, CAPILLARY
Glucose-Capillary: 155 mg/dL — ABNORMAL HIGH (ref 70–99)
Glucose-Capillary: 131 mg/dL — ABNORMAL HIGH (ref 70–99)

## 2023-10-29 LAB — HEMOGLOBIN A1C
Hgb A1c MFr Bld: 6.3 % — ABNORMAL HIGH (ref 4.8–5.6)
Mean Plasma Glucose: 134.11 mg/dL

## 2023-10-29 LAB — MAGNESIUM: Magnesium: 1.6 mg/dL — ABNORMAL LOW (ref 1.7–2.4)

## 2023-10-29 LAB — SURGICAL PCR SCREEN
MRSA, PCR: NEGATIVE
Staphylococcus aureus: NEGATIVE

## 2023-10-29 MED ORDER — ENOXAPARIN SODIUM 60 MG/0.6ML IJ SOSY
50.0000 mg | PREFILLED_SYRINGE | INTRAMUSCULAR | Status: DC
  Administered 2023-10-29 – 2023-11-04 (×6): 50 mg via SUBCUTANEOUS
  Filled 2023-10-29 (×6): qty 0.6

## 2023-10-29 MED ORDER — GABAPENTIN 300 MG PO CAPS
300.0000 mg | ORAL_CAPSULE | ORAL | Status: AC
  Administered 2023-10-30: 300 mg via ORAL
  Filled 2023-10-29: qty 1

## 2023-10-29 MED ORDER — POLYETHYLENE GLYCOL 3350 17 GM/SCOOP PO POWD
1.0000 | Freq: Once | ORAL | Status: AC
  Administered 2023-10-29: 255 g via ORAL
  Filled 2023-10-29: qty 255

## 2023-10-29 MED ORDER — ALVIMOPAN 12 MG PO CAPS
12.0000 mg | ORAL_CAPSULE | ORAL | Status: AC
  Administered 2023-10-30: 12 mg via ORAL
  Filled 2023-10-29 (×2): qty 1

## 2023-10-29 MED ORDER — SODIUM CHLORIDE 0.9 % IV SOLN: INTRAVENOUS | Status: DC

## 2023-10-29 MED ORDER — BUPIVACAINE LIPOSOME 1.3 % IJ SUSP: 20.0000 mL | Freq: Once | INTRAMUSCULAR | Status: DC

## 2023-10-29 MED ORDER — ENSURE PRE-SURGERY PO LIQD
592.0000 mL | Freq: Once | ORAL | Status: AC
  Administered 2023-10-30: 592 mL via ORAL
  Filled 2023-10-29: qty 592

## 2023-10-29 MED ORDER — PROCHLORPERAZINE EDISYLATE 10 MG/2ML IJ SOLN: 5.0000 mg | Freq: Four times a day (QID) | INTRAMUSCULAR | Status: DC | PRN

## 2023-10-29 MED ORDER — SODIUM CHLORIDE 0.9 % IV SOLN
2.0000 g | INTRAVENOUS | Status: AC
  Administered 2023-10-30: 2 g via INTRAVENOUS
  Filled 2023-10-29: qty 2

## 2023-10-29 MED ORDER — LORAZEPAM 2 MG/ML IJ SOLN
0.5000 mg | Freq: Once | INTRAMUSCULAR | Status: AC
  Administered 2023-10-29: 0.5 mg via INTRAVENOUS
  Filled 2023-10-29: qty 1

## 2023-10-29 MED ORDER — NEOMYCIN SULFATE 500 MG PO TABS
1000.0000 mg | ORAL_TABLET | ORAL | Status: AC
  Administered 2023-10-29 – 2023-10-30 (×3): 1000 mg via ORAL
  Filled 2023-10-29 (×3): qty 2

## 2023-10-29 MED ORDER — INSULIN ASPART 100 UNIT/ML IJ SOLN
0.0000 [IU] | INTRAMUSCULAR | Status: DC
  Administered 2023-10-29: 2 [IU] via SUBCUTANEOUS
  Administered 2023-10-29: 1 [IU] via SUBCUTANEOUS
  Administered 2023-10-30: 4 [IU] via SUBCUTANEOUS
  Filled 2023-10-29: qty 0.09
  Filled 2023-10-29: qty 1

## 2023-10-29 MED ORDER — METRONIDAZOLE 500 MG PO TABS
1000.0000 mg | ORAL_TABLET | ORAL | Status: AC
  Administered 2023-10-29 – 2023-10-30 (×3): 1000 mg via ORAL
  Filled 2023-10-29 (×2): qty 2

## 2023-10-29 MED ORDER — ACETAMINOPHEN 500 MG PO TABS
1000.0000 mg | ORAL_TABLET | ORAL | Status: AC
  Administered 2023-10-30: 1000 mg via ORAL
  Filled 2023-10-29: qty 2

## 2023-10-29 MED ORDER — BISACODYL 5 MG PO TBEC
20.0000 mg | DELAYED_RELEASE_TABLET | Freq: Once | ORAL | Status: AC
  Administered 2023-10-29: 20 mg via ORAL
  Filled 2023-10-29: qty 4

## 2023-10-29 MED ORDER — KETOROLAC TROMETHAMINE 15 MG/ML IJ SOLN
15.0000 mg | Freq: Three times a day (TID) | INTRAMUSCULAR | Status: AC | PRN
  Administered 2023-10-29: 15 mg via INTRAVENOUS
  Filled 2023-10-29: qty 1

## 2023-10-29 MED ORDER — MAGNESIUM SULFATE 2 GM/50ML IV SOLN
2.0000 g | Freq: Once | INTRAVENOUS | Status: AC
  Administered 2023-10-29: 2 g via INTRAVENOUS
  Filled 2023-10-29: qty 50

## 2023-10-29 MED ORDER — ENSURE PRE-SURGERY PO LIQD
296.0000 mL | Freq: Once | ORAL | Status: DC
  Filled 2023-10-29: qty 296

## 2023-10-29 MED ORDER — CHLORHEXIDINE GLUCONATE CLOTH 2 % EX PADS
6.0000 | MEDICATED_PAD | Freq: Once | CUTANEOUS | Status: AC
  Administered 2023-10-30: 6 via TOPICAL

## 2023-10-29 MED ORDER — CHLORHEXIDINE GLUCONATE CLOTH 2 % EX PADS
6.0000 | MEDICATED_PAD | Freq: Once | CUTANEOUS | Status: AC
  Administered 2023-10-29: 6 via TOPICAL

## 2023-10-29 MED ORDER — MELATONIN 5 MG PO TABS
5.0000 mg | ORAL_TABLET | Freq: Every evening | ORAL | Status: AC | PRN
Start: 2023-10-29 — End: 2023-11-03
  Administered 2023-10-29 – 2023-11-03 (×3): 5 mg via ORAL
  Filled 2023-10-29 (×3): qty 1

## 2023-10-29 MED ORDER — MUPIROCIN 2 % EX OINT: 1.0000 | TOPICAL_OINTMENT | Freq: Two times a day (BID) | CUTANEOUS | Status: DC

## 2023-10-29 NOTE — ED Notes (Signed)
Pt's wife took wallet and left him with his cell phone only.

## 2023-10-29 NOTE — ED Notes (Signed)
Patient asking for something to eat, informed him that his diet says clear liquid. Pt given a diet sprite.

## 2023-10-29 NOTE — Progress Notes (Signed)
Patient seen and examined personally, I reviewed the chart, history and physical and admission note, done by admitting physician this morning and agree with the same with following addendum.  Please refer to the morning admission note for more detailed plan of care.  Briefly,  67 y.o.m w/ CAD s/p PCI with a bare-metal stent in 2013, HFrEF, type 2 diabetes, hyperlipidemia, obesity, who presented to the ED, referred to by GI due to progressive diffuse abdominal pain, distention, vomiting and unintentional weight loss x 2 weeks In the ED: Vitals BP on higher side not hypoxic Labs with hyponatremia BUN 44 TB 1.5 troponin 17 hemoglobin 8.7 CT chest abdomen pelvis W/ contrast>> findings suspicious for annular colonic adenocarcinoma, probable direct invasion of an adjacent loop of small bowel within the right upper quadrant and extensive mesenteric infiltration, extensive pathologic adenopathy, possible isolated hepatic metastasis, resultant large bowel obstruction.Scattered subpleural pulmonary nodules all measuring less than 3 mm likely postinfectious or postinflammatory in nature. 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  Surgery has been consulted along with GI, keep n.p.o., IV hydration pain management monitor electrolytes.  Planning for bowel prep today then open partial colectomy 11/7.  Hypovolemic hyponatremia: Due to poor oral intake. Check urine electrolytes TSH a.m. cortisol?   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 continue IVF and monitor for now.  Metabolic acidosis bicarb at 19 continue IVF  Hypomagnesemia: Replaced.  HFrEF CAD:  Anemia of malignancy/coronary disease: Monitor  Isolated hyperbilirubinemia: Monitor  Type diabetes mellitus: Well-controlled, continue SSI  Prolonged service provided for 26 minutes

## 2023-10-29 NOTE — ED Notes (Signed)
ED TO INPATIENT HANDOFF REPORT  Name/Age/Gender [name] 67 y.o. male  Code Status    Code Status Orders  (From admission, onward)           Start     Ordered   10/29/23 0209  Full code  Continuous       Question:  By:  Answer:  Consent: discussion documented in EHR   10/29/23 0208           Code Status History     This patient has a current code status but no historical code status.       Home/SNF/Other Home  Chief Complaint Bowel obstruction (HCC) [K56.609]  Level of Care/Admitting Diagnosis ED Disposition     ED Disposition  Admit   Condition  --   Comment  Hospital Area: [facility] [[id number]]  Level of Care: Telemetry [5]  Admit to tele based on following criteria: Monitor for Ischemic changes  May admit patient to [facility] or [facility] if equivalent level of care is available:: Yes  Covid Evaluation: Asymptomatic - no recent exposure (last 10 days) testing not required  Diagnosis: Bowel obstruction [facility]) [[id number]]  Admitting Physician: Darlin Drop [[id number]]  Attending Physician: Darlin Drop [[id number]]  Certification:: I certify this patient will need inpatient services for at least 2 midnights  Expected Medical Readiness: 10/31/2023          Medical History Past Medical History:  Diagnosis Date   Abnormal chest CT    Pulmonary nodules, right posterior chest wall soft tissue density - recommended to f/u PCP   Arthritis    CAD (coronary artery disease)    Newly diagnosed single-vessel CAD by cath 12/27/11 s/p BMS to RCA (95% eccentric lesion proximally, 30-40% distally)   Cancer (HCC)    skin cancer on left ear   Diabetes mellitus    Newly diagnosed 12/2011 with A1C of 9.6   Hyperlipidemia    Hypertension    PSVT (paroxysmal supraventricular tachycardia)    Systolic CHF (HCC)    Diagnosed 12/2011 - possibly NICM felt secondary to hypertension and diabetes, given degree of LV dysfunction in the setting of  single-vessel CAD    Allergies No Known Allergies  IV Location/Drains/Wounds Patient Lines/Drains/Airways Status     Active Line/Drains/Airways     Name Placement date Placement time Site Days   Peripheral IV 10/28/23 20 G Anterior;Distal;Left;Upper Arm 10/28/23  2152  Arm  1            Labs/Imaging Results for orders placed or performed during the hospital encounter of 10/28/23 (from the past 48 hour(s))  Lipase, blood     Status: None   Collection Time: 10/28/23  4:40 PM  Result Value Ref Range   Lipase 31 11 - 51 U/L    Comment: Performed at [facility], [address] [place], [place] [address]  Comprehensive metabolic panel     Status: Abnormal   Collection Time: 10/28/23  4:40 PM  Result Value Ref Range   Sodium 127 (L) 135 - 145 mmol/L   Potassium 3.8 3.5 - 5.1 mmol/L   Chloride 94 (L) 98 - 111 mmol/L   CO2 22 22 - 32 mmol/L   Glucose, Bld 135 (H) 70 - 99 mg/dL    Comment: Glucose reference range applies only to samples taken after fasting for at least 8 hours.   BUN 44 (H) 8 - 23 mg/dL   Creatinine, Ser 9.62 0.61 -  1.24 mg/dL   Calcium 9.3 8.9 - 16.1 mg/dL   Total Protein 7.8 6.5 - 8.1 g/dL   Albumin 3.5 3.5 - 5.0 g/dL   AST 26 15 - 41 U/L   ALT 27 0 - 44 U/L   Alkaline Phosphatase 78 38 - 126 U/L   Total Bilirubin 1.5 (H) <1.2 mg/dL   GFR, Estimated >09 >60 mL/min    Comment: (NOTE) Calculated using the CKD-EPI Creatinine Equation (2021)    Anion gap 11 5 - 15    Comment: Performed at [facility], [address] [place], [place] [address]  CBC     Status: Abnormal   Collection Time: 10/28/23  4:40 PM  Result Value Ref Range   WBC 5.1 4.0 - 10.5 K/uL   RBC 2.82 (L) 4.22 - 5.81 MIL/uL   Hemoglobin 8.7 (L) 13.0 - 17.0 g/dL   HCT 81.1 (L) 91.4 - 78.2 %   MCV 91.1 80.0 - 100.0 fL   MCH 30.9 26.0 - 34.0 pg   MCHC 33.9 30.0 - 36.0 g/dL   RDW 95.6 21.3 - 08.6 %   Platelets 224 150 - 400 K/uL   nRBC 0.0 0.0 - 0.2  %    Comment: Performed at [facility], [address] [place], [place] [address]  Troponin I (High Sensitivity)     Status: None   Collection Time: 10/28/23 10:59 PM  Result Value Ref Range   Troponin I (High Sensitivity) 17 <18 ng/L    Comment: (NOTE) Elevated high sensitivity troponin I (hsTnI) values and significant  changes across serial measurements may suggest ACS but many other  chronic and acute conditions are known to elevate hsTnI results.  Refer to the "Links" section for chest pain algorithms and additional  guidance. Performed at [facility], [address], [place], [place] [address]   Urinalysis, Routine w reflex microscopic -Urine, Clean Catch     Status: Abnormal   Collection Time: 10/29/23  2:44 AM  Result Value Ref Range   Color, Urine YELLOW YELLOW   APPearance CLEAR CLEAR   Specific Gravity, Urine 1.041 (H) 1.005 - 1.030   pH 5.0 5.0 - 8.0   Glucose, UA NEGATIVE NEGATIVE mg/dL   Hgb urine dipstick NEGATIVE NEGATIVE   Bilirubin Urine NEGATIVE NEGATIVE   Ketones, ur 5 (A) NEGATIVE mg/dL   Protein, ur NEGATIVE NEGATIVE mg/dL   Nitrite NEGATIVE NEGATIVE   Leukocytes,Ua NEGATIVE NEGATIVE    Comment: Performed at [facility], [address] [place], [place] [address]  CBC     Status: Abnormal   Collection Time: 10/29/23  6:17 AM  Result Value Ref Range   WBC 3.3 (L) 4.0 - 10.5 K/uL   RBC 2.44 (L) 4.22 - 5.81 MIL/uL   Hemoglobin 7.6 (L) 13.0 - 17.0 g/dL   HCT 24.4 (L) 01.0 - 27.2 %   MCV 90.6 80.0 - 100.0 fL   MCH 31.1 26.0 - 34.0 pg   MCHC 34.4 30.0 - 36.0 g/dL   RDW 53.6 64.4 - 03.4 %   Platelets 187 150 - 400 K/uL   nRBC 0.0 0.0 - 0.2 %    Comment: Performed at [facility], [address], [place], [place] [address]  Basic metabolic panel     Status: Abnormal   Collection Time: 10/29/23  6:17 AM  Result Value Ref Range   Sodium 127 (L) 135 - 145 mmol/L   Potassium 3.5  3.5 - 5.1 mmol/L   Chloride 98 98 - 111 mmol/L   CO2 19 (L) 22 - 32 mmol/L   Glucose, Bld 110 (H) 70 - 99 mg/dL    Comment: Glucose reference range applies only to samples taken after fasting for at least 8 hours.   BUN 38 (H) 8 - 23 mg/dL   Creatinine, Ser 4.69 0.61 - 1.24 mg/dL   Calcium 8.8 (L) 8.9 - 10.3 mg/dL   GFR, Estimated >62 >95 mL/min    Comment: (NOTE) Calculated using the CKD-EPI Creatinine Equation (2021)    Anion gap 10 5 - 15    Comment: Performed at [facility], [address] [place], [place] [address]  Magnesium     Status: Abnormal   Collection Time: 10/29/23  6:17 AM  Result Value Ref Range   Magnesium 1.6 (L) 1.7 - 2.4 mg/dL    Comment: Performed at [facility], [address], [place], [place] [address]  Phosphorus     Status: None   Collection Time: 10/29/23  6:17 AM  Result Value Ref Range   Phosphorus 2.8 2.5 - 4.6 mg/dL    Comment: Performed at [facility], [address], [place], [place] [address]  CBG monitoring, ED     Status: Abnormal   Collection Time: 10/29/23  8:05 AM  Result Value Ref Range   Glucose-Capillary 100 (H) 70 - 99 mg/dL    Comment: Glucose reference range applies only to samples taken after fasting for at least 8 hours.   CT CHEST ABDOMEN PELVIS W CONTRAST  Result Date: 10/28/2023 CLINICAL DATA:  Unintentional weight loss, unspecified abdominal pain, diarrhea EXAM: CT CHEST, ABDOMEN, AND PELVIS WITH CONTRAST TECHNIQUE: Multidetector CT imaging of the chest, abdomen and pelvis was performed following the standard protocol during bolus administration of intravenous contrast. RADIATION DOSE REDUCTION: This exam was performed according to the departmental dose-optimization program which includes automated exposure control, adjustment of the mA and/or kV according to patient size and/or use of iterative reconstruction technique. CONTRAST:  OMNIPAQUE IOHEXOL 300 MG/ML  SOLN  COMPARISON:  None Available. FINDINGS: CT CHEST FINDINGS Cardiovascular: Moderate multi-vessel coronary artery calcification. Global cardiac size is within normal limits. No pericardial effusion. Central pulmonary arteries are enlarged in keeping with changes of pulmonary arterial hypertension. Mild atherosclerotic calcification within the thoracic aorta. No aortic aneurysm. Mediastinum/Nodes: No enlarged mediastinal, hilar, or axillary lymph nodes. Thyroid gland, trachea, and esophagus demonstrate no significant findings. Lungs/Pleura: Mild emphysema. Trace right pleural effusion. Scattered subpleural pulmonary nodules all measuring less than 3 mm are seen throughout the upper lobes bilaterally, likely post infectious or post inflammatory in nature. No confluent pulmonary infiltrate. No pneumothorax. No central obstructing lesion. Musculoskeletal: No acute bone abnormality. No lytic or blastic bone lesion. AA 5.4 x 8.9 cm macroscopic fatty mass is seen within the right posterior chest wall between the supraspinatus and trapezius musculature compatible with a chest wall lipoma. Coarse central calcification noted. CT ABDOMEN PELVIS FINDINGS Hepatobiliary: A 16 mm hypodense lesion is seen within the inferior right hepatic lobe at axial image # 71/2, indeterminate. Isolated hepatic metastasis is not excluded. Liver otherwise unremarkable. Gallbladder unremarkable. No intra or extrahepatic biliary ductal dilation. Pancreas: Unremarkable unremarkable Spleen: Normal in size without focal abnormality. Adrenals/Urinary Tract: Adrenal glands are unremarkable. Kidneys are normal, without renal calculi, focal lesion, or hydronephrosis. Bladder is unremarkable. Stomach/Bowel: There is circumferential, irregular mural thickening involving the hepatic flexure of the colon, best seen on  axial image # 70/2 and sagittal image # 75/10 suspicious for a annular colonic adenocarcinoma. The mass demonstrates infiltration into the  pericolonic soft tissues in keeping with transmural extension and there is obliteration of the intervening fat plane with the adjacent small bowel, best seen on coronal image # 120/8 and axial image # 70/2 suggesting direct invasion of the structure. There is extensive infiltration within the adjacent colonic mesentery. There is extensive pathologic mesenteric, periceliac, periportal, aortocaval, and left periaortic lymphadenopathy. Index lymph node within the periportal lymph node groups measures 3.2 x 6.5 cm at axial image # 59/2. There is a resultant large bowel obstruction with fluid-filled dilated loops of terminal small bowel as well as the cecum. No free intraperitoneal gas or fluid. Stomach and proximal small bowel as well as the distal colon are unremarkable. Vascular/Lymphatic: Extensive aortoiliac atherosclerotic calcification. No aortic aneurysm. Extensive pathologic adenopathy as described above. Reproductive: Prostate is unremarkable. Other: No abdominal wall hernia Musculoskeletal: No acute bone abnormality. No lytic or blastic bone lesion. IMPRESSION: 1. Circumferential, irregular mural thickening involving the hepatic flexure of the colon suspicious for a annular colonic adenocarcinoma. Probable direct invasion of an adjacent loop of small bowel within the right upper quadrant and extensive mesenteric infiltration. Extensive pathologic adenopathy as described above. Possible isolated hepatic metastasis. 2. Resultant large bowel obstruction. 3. 16 mm hypodense lesion within the inferior right hepatic lobe, as noted above, suspicious for an isolated hepatic metastasis. This could be confirmed with contrast enhanced MRI examination. 4. Moderate multi-vessel coronary artery calcification. 5. Trace right pleural effusion. 6. Scattered subpleural pulmonary nodules all measuring less than 3 mm throughout the upper lobes bilaterally, likely post infectious or post inflammatory in nature. Close attention on  subsequent surveillance examination is warranted. 7. 8.9 cm macroscopic fatty mass within the right posterior chest wall between the supraspinatus and trapezius musculature compatible with a chest wall lipoma. Aortic Atherosclerosis (ICD10-I70.0) and Emphysema (ICD10-J43.9). Electronically Signed   By: Helyn Numbers M.D.   On: 10/28/2023 23:07    Pending Labs Unresulted Labs (From admission, onward)     Start     Ordered   11/05/23 0500  Creatinine, serum  (enoxaparin (LOVENOX)    CrCl >/= 30 ml/min)  Weekly,   R     Comments: while on enoxaparin therapy    10/29/23 0208   10/30/23 0500  HIV Antibody (routine testing w rflx)  (HIV Antibody (Routine testing w reflex) panel)  Tomorrow morning,   R        10/29/23 0210   10/30/23 0500  CBC  Tomorrow morning,   R        10/29/23 0609   10/30/23 0500  Hemoglobin A1c  Tomorrow morning,   R       Comments: To assess prior glycemic control    10/29/23 0609   10/30/23 0500  Basic metabolic panel  Daily,   R      10/29/23 0915   10/30/23 0500  Magnesium  Tomorrow morning,   R        10/29/23 0915   Signed and Held  Hemoglobin A1c  Once,   R        Signed and Held            Vitals/Pain Today's Vitals   10/29/23 0617 10/29/23 0630 10/29/23 0900 10/29/23 1026  BP:  108/73 125/63   Pulse:  89 79   Resp:  17 20   Temp:  98 F (36.7 C)  97.8 F (36.6 C)  TempSrc:    Oral  SpO2:  97% 98%   Weight:      Height:      PainSc: Asleep       Isolation Precautions No active isolations  Medications Medications  enoxaparin (LOVENOX) injection 50 mg (has no administration in time range)  0.9 %  sodium chloride infusion ( Intravenous New Bag/Given 10/29/23 0252)  prochlorperazine (COMPAZINE) injection 5 mg (has no administration in time range)  ketorolac (TORADOL) 15 MG/ML injection 15 mg (has no administration in time range)  insulin aspart (novoLOG) injection 0-9 Units ( Subcutaneous Not Given 10/29/23 0812)  bisacodyl (DULCOLAX) EC  tablet 20 mg (has no administration in time range)  polyethylene glycol powder (GLYCOLAX/MIRALAX) container 255 g (has no administration in time range)  neomycin (MYCIFRADIN) tablet 1,000 mg (has no administration in time range)    And  metroNIDAZOLE (FLAGYL) tablet 1,000 mg (has no administration in time range)  magnesium sulfate IVPB 2 g 50 mL (2 g Intravenous New Bag/Given 10/29/23 1010)  lactated ringers bolus 1,000 mL (1,000 mLs Intravenous Bolus 10/28/23 2228)  iohexol (OMNIPAQUE) 300 MG/ML solution 100 mL (100 mLs Intravenous Contrast Given 10/28/23 2158)  LORazepam (ATIVAN) injection 0.5 mg (0.5 mg Intravenous Given 10/29/23 0242)    Mobility walks

## 2023-10-29 NOTE — Progress Notes (Signed)
Patient's case discussed with my partner Dr. Lorenso Quarry and his hospital computer chart reviewed and surgery note appreciated and plans for surgery tomorrow will be on standby to help if GI assistance is needed and please call me this week if any question or problem I can help with

## 2023-10-29 NOTE — Progress Notes (Addendum)
Paged Dr. Dwain Sarna - covering Surgeon.  Notified that patient is unable to tolerate the second half of his miralax/gatorade bowel prep (patient did drink 32 ounces already but just cannot "fit in" the rest after eating a full tray of clear liquid dinner tray).  Per Dr. Dwain Sarna - patient has obstruction and it's okay not to finish the rest of the drink.  Informed patient that he may suspend the miralax/gatorade drink.  Consent obtained for tomorrow's surgery.

## 2023-10-29 NOTE — H&P (Addendum)
History and Physical  [name] WGN:[id number] DOB: [birth year] DOA: 10/28/2023  Referring physician: Dr. Earlene Plater, EDP  PCP: [name], DO  Outpatient Specialists: GI Patient coming from: Home  Chief Complaint: Abdominal pain and diarrhea.  HPI: [name] is a 67 y.o. male with medical history significant for coronary artery disease status post PCI with a bare-metal stent in 2013, HFrEF, type 2 diabetes, hyperlipidemia, obesity, who presented to the ED, referred to by GI due to progressive diffuse abdominal pain, distention, vomiting and unintentional weight loss.  Symptoms started 2 weeks ago.  Denies any subjective fevers.  In the ED, contrast-enhanced CT chest abdomen pelvis revealed findings suspicious for annular colonic adenocarcinoma, probable direct invasion of an adjacent loop of small bowel within the right upper quadrant and extensive mesenteric infiltration, extensive pathologic adenopathy, possible isolated hepatic metastasis, resultant large bowel obstruction.  Scattered subpleural pulmonary nodules all measuring less than 3 mm likely postinfectious or postinflammatory in nature.  EDP discussed large bowel obstruction findings with general surgery who recommended admission by hospitalist service.  General surgery will see in consultation.  Admitted by [facility], hospitalist service.  ED Course: BP 170/63, pulse 84, respiratory 27, saturation 97% on room air.  Lab studies significant for serum sodium 127, glucose 125, BUN 44, creatinine 1.0, T. bili 1.5.  Troponin 17.  Hemoglobin 8.7, WBC 5.1, platelet 224.  Review of Systems: Review of systems as noted in the HPI. All other systems reviewed and are negative.   Past Medical History:  Diagnosis Date   Abnormal chest CT    Pulmonary nodules, right posterior chest wall soft tissue density - recommended to f/u PCP   Arthritis    CAD (coronary artery disease)    Newly diagnosed single-vessel CAD by cath 12/27/11 s/p BMS to  RCA (95% eccentric lesion proximally, 30-40% distally)   Cancer (HCC)    skin cancer on left ear   Diabetes mellitus    Newly diagnosed 12/2011 with A1C of 9.6   Hyperlipidemia    Hypertension    PSVT (paroxysmal supraventricular tachycardia)    Systolic CHF (HCC)    Diagnosed 12/2011 - possibly NICM felt secondary to hypertension and diabetes, given degree of LV dysfunction in the setting of single-vessel CAD   Past Surgical History:  Procedure Laterality Date   HERNIA REPAIR     LEFT AND RIGHT HEART CATHETERIZATION WITH CORONARY ANGIOGRAM N/A 12/27/2011   Procedure: LEFT AND RIGHT HEART CATHETERIZATION WITH CORONARY ANGIOGRAM;  Surgeon: Peter M Swaziland, MD;  Location: [facility] CATH LAB;  Service: Cardiovascular;  Laterality: N/A;   PERCUTANEOUS CORONARY STENT INTERVENTION (PCI-S)  12/27/2011   Procedure: PERCUTANEOUS CORONARY STENT INTERVENTION (PCI-S);  Surgeon: Peter M Swaziland, MD;  Location: [facility] CATH LAB;  Service: Cardiovascular;;   TONSILLECTOMY     VASECTOMY      Social History:  reports that he quit smoking about 21 years ago. His smoking use included cigarettes. He started smoking about 57 years ago. He has a 54 pack-year smoking history. He has never used smokeless tobacco. He reports current alcohol use of about 24.0 standard drinks of alcohol per week. He reports that he does not use drugs.   No Known Allergies  Family History  Problem Relation Age of Onset   Emphysema Mother    Heart disease Brother    Heart disease Maternal Grandmother    Heart disease Paternal Grandfather    Clotting disorder Brother    Cancer Paternal Grandfather  Prior to Admission medications   Medication Sig Start Date End Date Taking? Authorizing Provider  aspirin EC 81 MG tablet Take 81 mg by mouth daily.   Yes [provider]  carvedilol (COREG) 25 MG tablet Take 1 tablet (25 mg total) by mouth 2 (two) times daily with a meal. 12/28/14  Yes Bensimhon, Bevelyn Buckles, MD  cyanocobalamin (VITAMIN  B12) 500 MCG tablet Take 500 mcg by mouth daily.   Yes [provider]  furosemide (LASIX) 40 MG tablet Take 1 tablet (40 mg total) by mouth daily. Patient taking differently: Take 40 mg by mouth 2 (two) times daily. 03/30/12 10/29/23 Yes Duke Salvia, MD  lisinopril (ZESTRIL) 2.5 MG tablet Take by mouth. 03/07/21  Yes [provider]  loperamide (IMODIUM) 2 MG capsule Take 4 mg by mouth 2 (two) times daily. 10/13/23  Yes [provider]  metFORMIN (GLUCOPHAGE) 1000 MG tablet Take 500 mg by mouth 2 (two) times daily with a meal.   Yes [provider]  nitroGLYCERIN (NITROSTAT) 0.4 MG SL tablet Place 1 tablet (0.4 mg total) under the tongue every 5 (five) minutes as needed for chest pain (up to 3 doses). 12/31/11 10/29/23 Yes Dunn, Dayna N, PA-C  Omega-3 Fatty Acids (FISH OIL PO) Take by mouth.   Yes [provider]  ACCU-CHEK FASTCLIX LANCETS MISC every other day.  06/03/12   [provider]  atorvastatin (LIPITOR) 80 MG tablet TAKE ONE TABLET (80 MG TOTAL) BY MOUTH AT BEDTIME. 09/17/12   Bensimhon, Bevelyn Buckles, MD  benzonatate (TESSALON) 100 MG capsule Take 100 mg by mouth 3 (three) times daily as needed. Patient not taking: Reported on 10/29/2023 09/10/23   [provider]  glimepiride (AMARYL) 2 MG tablet Take 3 mg by mouth daily before breakfast.  Patient not taking: Reported on 10/29/2023 12/31/11 11/02/14  Laurann Montana, PA-C  Multiple Vitamin (MULTIVITAMIN) capsule Take 1 capsule by mouth daily. Patient not taking: Reported on 10/29/2023    [provider]  OZEMPIC, 0.25 OR 0.5 MG/DOSE, 2 MG/1.5ML SOPN Inject into the skin. Patient not taking: Reported on 10/29/2023 03/25/21   [provider]  spironolactone (ALDACTONE) 25 MG tablet Take 1 tablet (25 mg total) by mouth daily. 12/31/11   Laurann Montana, PA-C    Physical Exam: BP 138/67   Pulse 86   Temp 98.1 F (36.7 C)   Resp 20   Ht 5\' 10"  (1.778 m)   Wt 104.3 kg   SpO2  97%   BMI 33.00 kg/m   General: 67 y.o. year-old male well developed well nourished in no acute distress.  Alert and oriented x3. Cardiovascular: Regular rate and rhythm with no rubs or gallops.  No thyromegaly or JVD noted.  No lower extremity edema. 2/4 pulses in all 4 extremities. Respiratory: Clear to auscultation with no wheezes or rales. Good inspiratory effort. Abdomen: Distended with hypoactive bowel sounds.   Muskuloskeletal: No cyanosis, clubbing or edema noted bilaterally Neuro: CN II-XII intact, strength, sensation, reflexes Skin: No ulcerative lesions noted or rashes Psychiatry: Judgement and insight appear normal. Mood is appropriate for condition and setting          Labs on Admission:  Basic Metabolic Panel: Recent Labs  Lab 10/28/23 1640  NA 127*  K 3.8  CL 94*  CO2 22  GLUCOSE 135*  BUN 44*  CREATININE 1.00  CALCIUM 9.3   Liver Function Tests: Recent Labs  Lab 10/28/23 1640  AST 26  ALT  27  ALKPHOS 78  BILITOT 1.5*  PROT 7.8  ALBUMIN 3.5   Recent Labs  Lab 10/28/23 1640  LIPASE 31   No results for input(s): "AMMONIA" in the last 168 hours. CBC: Recent Labs  Lab 10/28/23 1640  WBC 5.1  HGB 8.7*  HCT 25.7*  MCV 91.1  PLT 224   Cardiac Enzymes: No results for input(s): "CKTOTAL", "CKMB", "CKMBINDEX", "TROPONINI" in the last 168 hours.  BNP (last 3 results) No results for input(s): "BNP" in the last 8760 hours.  ProBNP (last 3 results) No results for input(s): "PROBNP" in the last 8760 hours.  CBG: No results for input(s): "GLUCAP" in the last 168 hours.  Radiological Exams on Admission: CT CHEST ABDOMEN PELVIS W CONTRAST  Result Date: 10/28/2023 CLINICAL DATA:  Unintentional weight loss, unspecified abdominal pain, diarrhea EXAM: CT CHEST, ABDOMEN, AND PELVIS WITH CONTRAST TECHNIQUE: Multidetector CT imaging of the chest, abdomen and pelvis was performed following the standard protocol during bolus administration of intravenous  contrast. RADIATION DOSE REDUCTION: This exam was performed according to the departmental dose-optimization program which includes automated exposure control, adjustment of the mA and/or kV according to patient size and/or use of iterative reconstruction technique. CONTRAST:  OMNIPAQUE IOHEXOL 300 MG/ML  SOLN COMPARISON:  None Available. FINDINGS: CT CHEST FINDINGS Cardiovascular: Moderate multi-vessel coronary artery calcification. Global cardiac size is within normal limits. No pericardial effusion. Central pulmonary arteries are enlarged in keeping with changes of pulmonary arterial hypertension. Mild atherosclerotic calcification within the thoracic aorta. No aortic aneurysm. Mediastinum/Nodes: No enlarged mediastinal, hilar, or axillary lymph nodes. Thyroid gland, trachea, and esophagus demonstrate no significant findings. Lungs/Pleura: Mild emphysema. Trace right pleural effusion. Scattered subpleural pulmonary nodules all measuring less than 3 mm are seen throughout the upper lobes bilaterally, likely post infectious or post inflammatory in nature. No confluent pulmonary infiltrate. No pneumothorax. No central obstructing lesion. Musculoskeletal: No acute bone abnormality. No lytic or blastic bone lesion. AA 5.4 x 8.9 cm macroscopic fatty mass is seen within the right posterior chest wall between the supraspinatus and trapezius musculature compatible with a chest wall lipoma. Coarse central calcification noted. CT ABDOMEN PELVIS FINDINGS Hepatobiliary: A 16 mm hypodense lesion is seen within the inferior right hepatic lobe at axial image # 71/2, indeterminate. Isolated hepatic metastasis is not excluded. Liver otherwise unremarkable. Gallbladder unremarkable. No intra or extrahepatic biliary ductal dilation. Pancreas: Unremarkable unremarkable Spleen: Normal in size without focal abnormality. Adrenals/Urinary Tract: Adrenal glands are unremarkable. Kidneys are normal, without renal calculi, focal  lesion, or hydronephrosis. Bladder is unremarkable. Stomach/Bowel: There is circumferential, irregular mural thickening involving the hepatic flexure of the colon, best seen on axial image # 70/2 and sagittal image # 75/10 suspicious for a annular colonic adenocarcinoma. The mass demonstrates infiltration into the pericolonic soft tissues in keeping with transmural extension and there is obliteration of the intervening fat plane with the adjacent small bowel, best seen on coronal image # 120/8 and axial image # 70/2 suggesting direct invasion of the structure. There is extensive infiltration within the adjacent colonic mesentery. There is extensive pathologic mesenteric, periceliac, periportal, aortocaval, and left periaortic lymphadenopathy. Index lymph node within the periportal lymph node groups measures 3.2 x 6.5 cm at axial image # 59/2. There is a resultant large bowel obstruction with fluid-filled dilated loops of terminal small bowel as well as the cecum. No free intraperitoneal gas or fluid. Stomach and proximal small bowel as well as the distal colon are unremarkable. Vascular/Lymphatic: Extensive aortoiliac atherosclerotic calcification.  No aortic aneurysm. Extensive pathologic adenopathy as described above. Reproductive: Prostate is unremarkable. Other: No abdominal wall hernia Musculoskeletal: No acute bone abnormality. No lytic or blastic bone lesion. IMPRESSION: 1. Circumferential, irregular mural thickening involving the hepatic flexure of the colon suspicious for a annular colonic adenocarcinoma. Probable direct invasion of an adjacent loop of small bowel within the right upper quadrant and extensive mesenteric infiltration. Extensive pathologic adenopathy as described above. Possible isolated hepatic metastasis. 2. Resultant large bowel obstruction. 3. 16 mm hypodense lesion within the inferior right hepatic lobe, as noted above, suspicious for an isolated hepatic metastasis. This could be  confirmed with contrast enhanced MRI examination. 4. Moderate multi-vessel coronary artery calcification. 5. Trace right pleural effusion. 6. Scattered subpleural pulmonary nodules all measuring less than 3 mm throughout the upper lobes bilaterally, likely post infectious or post inflammatory in nature. Close attention on subsequent surveillance examination is warranted. 7. 8.9 cm macroscopic fatty mass within the right posterior chest wall between the supraspinatus and trapezius musculature compatible with a chest wall lipoma. Aortic Atherosclerosis (ICD10-I70.0) and Emphysema (ICD10-J43.9). Electronically Signed   By: Helyn Numbers M.D.   On: 10/28/2023 23:07    EKG: I independently viewed the EKG done and my findings are as followed: Sinus rhythm rate of 88.  Nonspecific ST-T changes.  QTc 428.  Assessment/Plan Present on Admission:  Bowel obstruction (HCC)  Principal Problem:   Bowel obstruction (HCC)  Large bowel obstruction seen on CT scan General Surgery consulted IV fluid hydration, IV antiemetics as needed Early mobilization, as tolerated Optimize electrolytes  Concern for colonic adenocarcinoma with possible mets seen on CT scan [facility] consulted N.p.o. until seen by GI  Type 2 diabetes with hyperglycemia Last hemoglobin A1c 9.6 in 2013 Obtain hemoglobin A1c Start insulin sliding scale every 4 hours while NPO  Hypovolemic hyponatremia Poor oral intake for the past 2 weeks Presented with serum sodium of 127 Gentle IV fluid hydration NS at 50 cc/h x 1 day.  Coronary artery disease status post PCI No reported anginal symptoms Resume home regimen when no longer n.p.o. Closely monitor on telemetry  HFrEF Euvolemic on exam Closely monitor volume status while on IV fluid Monitor strict I's and O's and daily weight   Anemia of chronic disease Hemoglobin 8.7 Closely monitor H&H  Isolated hyperbilirubinemia Correlate with possible mets to the liver T. bili 1.5,  monitor   Time: 75 minutes.   DVT prophylaxis: Subcu developed daily.  Code Status: Full code  Family Communication: Wife at bedside.  Disposition Plan: Admitted to telemetry unit  Consults called: General Surgery, GI.  Admission status: Inpatient status.   Status is: Inpatient The patient requires at least 2 midnights for further evaluation and treatment of present condition.   Darlin Drop MD [facility] Pager [phone]  If 7PM-7AM, please contact night-coverage [email or web address] Password [email or web address]  10/29/2023, 2:06 AM

## 2023-10-29 NOTE — Progress Notes (Signed)
Gatorade arrived from kitchen at 1435.  Mixed well with Miralax and presented to patient.  He is hard of hearing, but he verbalizes understanding to drink over the next few hours.  Call bell placed next to his hand. Bedside commode placed next to his bed. He is attached to IV pole and cardiac monitor, so we insisted that he calls for assistance. He demonstrates ability to use the call bell.

## 2023-10-29 NOTE — ED Notes (Signed)
Lab called and stated that not enough yellow tubes for HIV testing. Informed lab tech that I sent 3 yellow tubes down for patient, 1 with sunquest sticker and 2 with patient label stickers. Lab tech then stated there was no order for the HIV testing, informed her that on my end there is an active order from the physician that was placed for a collection at 0500. She stated she did not have an order for the HIV testing, informed her that on my end I show an active HIV testing order. Lab tech stated, "Well I am just letting you know I cannot run the blood." MD made aware.

## 2023-10-29 NOTE — Hospital Course (Addendum)
67 y.o.m w/ CAD s/p PCI with a bare-metal stent in 2013, HFrEF, type 2 diabetes, hyperlipidemia, obesity, who presented to the ED, referred to by GI due to progressive diffuse abdominal pain, distention, vomiting and unintentional weight loss x 2 weeks In the ED: Vitals BP on higher side not hypoxic Labs with hyponatremia BUN 44 TB 1.5 troponin 17 hemoglobin 8.7 CT chest abdomen pelvis W/ contrast>> findings suspicious for annular colonic adenocarcinoma, probable direct invasion of an adjacent loop of small bowel within the right upper quadrant and extensive mesenteric infiltration, extensive pathologic adenopathy, possible isolated hepatic metastasis, resultant large bowel obstruction.Scattered subpleural pulmonary nodules all measuring less than 3 mm likely postinfectious or postinflammatory in nature. EDP discussed large bowel obstruction findings with general surgery who recommended admission by hospitalist

## 2023-10-30 ENCOUNTER — Inpatient Hospital Stay (HOSPITAL_COMMUNITY): Payer: Medicare HMO | Admitting: Anesthesiology

## 2023-10-30 ENCOUNTER — Encounter (HOSPITAL_COMMUNITY)
Admission: EM | Disposition: A | Discharge status: Hospice | Payer: Self-pay | Source: Home / Self Care | Attending: Internal Medicine

## 2023-10-30 ENCOUNTER — Other Ambulatory Visit: Payer: Self-pay

## 2023-10-30 ENCOUNTER — Encounter (HOSPITAL_COMMUNITY): Payer: Self-pay | Admitting: Internal Medicine

## 2023-10-30 ENCOUNTER — Inpatient Hospital Stay (HOSPITAL_COMMUNITY): Payer: Self-pay | Admitting: Anesthesiology

## 2023-10-30 DIAGNOSIS — K6389 Other specified diseases of intestine: Secondary | ICD-10-CM | POA: Diagnosis not present

## 2023-10-30 DIAGNOSIS — C787 Secondary malignant neoplasm of liver and intrahepatic bile duct: Secondary | ICD-10-CM | POA: Diagnosis not present

## 2023-10-30 DIAGNOSIS — C189 Malignant neoplasm of colon, unspecified: Secondary | ICD-10-CM

## 2023-10-30 DIAGNOSIS — C786 Secondary malignant neoplasm of retroperitoneum and peritoneum: Secondary | ICD-10-CM | POA: Diagnosis not present

## 2023-10-30 DIAGNOSIS — D649 Anemia, unspecified: Secondary | ICD-10-CM

## 2023-10-30 DIAGNOSIS — E871 Hypo-osmolality and hyponatremia: Secondary | ICD-10-CM | POA: Diagnosis not present

## 2023-10-30 DIAGNOSIS — K56609 Unspecified intestinal obstruction, unspecified as to partial versus complete obstruction: Secondary | ICD-10-CM | POA: Diagnosis not present

## 2023-10-30 HISTORY — PX: PARTIAL COLECTOMY: SHX5273

## 2023-10-30 LAB — BASIC METABOLIC PANEL
Anion gap: 10 (ref 5–15)
BUN: 30 mg/dL — ABNORMAL HIGH (ref 8–23)
CO2: 22 mmol/L (ref 22–32)
Calcium: 8.8 mg/dL — ABNORMAL LOW (ref 8.9–10.3)
Chloride: 95 mmol/L — ABNORMAL LOW (ref 98–111)
Creatinine, Ser: 0.93 mg/dL (ref 0.61–1.24)
GFR, Estimated: >60 mL/min (ref >60)
Glucose, Bld: 220 mg/dL — ABNORMAL HIGH (ref 70–99)
Potassium: 3.4 mmol/L — ABNORMAL LOW (ref 3.5–5.1)
Sodium: 127 mmol/L — ABNORMAL LOW (ref 135–145)

## 2023-10-30 LAB — CBC
HCT: 24.6 % — ABNORMAL LOW (ref 39.0–52.0)
Hemoglobin: 8 g/dL — ABNORMAL LOW (ref 13.0–17.0)
MCH: 30.1 pg (ref 26.0–34.0)
MCHC: 32.5 g/dL (ref 30.0–36.0)
MCV: 92.5 fL (ref 80.0–100.0)
Platelets: 196 10*3/uL (ref 150–400)
RBC: 2.66 MIL/uL — ABNORMAL LOW (ref 4.22–5.81)
RDW: 12.5 % (ref 11.5–15.5)
WBC: 3.4 10*3/uL — ABNORMAL LOW (ref 4.0–10.5)
nRBC: 0 % (ref 0.0–0.2)

## 2023-10-30 LAB — HIV ANTIBODY (ROUTINE TESTING W REFLEX): HIV Screen 4th Generation wRfx: NONREACTIVE

## 2023-10-30 LAB — ABO/RH: ABO/RH(D): O POS

## 2023-10-30 LAB — CEA: CEA: 17.1 ng/mL — ABNORMAL HIGH (ref 0.0–4.7)

## 2023-10-30 LAB — PREPARE RBC (CROSSMATCH)

## 2023-10-30 LAB — GLUCOSE, CAPILLARY
Glucose-Capillary: 102 mg/dL — ABNORMAL HIGH (ref 70–99)
Glucose-Capillary: 103 mg/dL — ABNORMAL HIGH (ref 70–99)
Glucose-Capillary: 133 mg/dL — ABNORMAL HIGH (ref 70–99)
Glucose-Capillary: 154 mg/dL — ABNORMAL HIGH (ref 70–99)
Glucose-Capillary: 202 mg/dL — ABNORMAL HIGH (ref 70–99)
Glucose-Capillary: 98 mg/dL (ref 70–99)

## 2023-10-30 LAB — MAGNESIUM: Magnesium: 1.9 mg/dL (ref 1.7–2.4)

## 2023-10-30 SURGERY — COLECTOMY, PARTIAL
Anesthesia: General

## 2023-10-30 MED ORDER — SODIUM CHLORIDE 0.9% FLUSH
10.0000 mL | Freq: Two times a day (BID) | INTRAVENOUS | Status: DC
  Administered 2023-10-30 – 2023-11-02 (×6): 10 mL via INTRAVENOUS

## 2023-10-30 MED ORDER — KETAMINE HCL 10 MG/ML IJ SOLN
INTRAMUSCULAR | Status: DC | PRN
  Administered 2023-10-30 (×2): 25 mg via INTRAVENOUS

## 2023-10-30 MED ORDER — ACETAMINOPHEN 10 MG/ML IV SOLN
1000.0000 mg | Freq: Four times a day (QID) | INTRAVENOUS | Status: DC
  Filled 2023-10-30: qty 100

## 2023-10-30 MED ORDER — SODIUM CHLORIDE 0.9% FLUSH
10.0000 mL | Freq: Two times a day (BID) | INTRAVENOUS | Status: DC
  Administered 2023-10-30 – 2023-11-04 (×9): 10 mL via INTRAVENOUS

## 2023-10-30 MED ORDER — ROCURONIUM BROMIDE 100 MG/10ML IV SOLN
INTRAVENOUS | Status: DC | PRN
  Administered 2023-10-30: 50 mg via INTRAVENOUS
  Administered 2023-10-30: 20 mg via INTRAVENOUS

## 2023-10-30 MED ORDER — FENTANYL CITRATE (PF) 100 MCG/2ML IJ SOLN
INTRAMUSCULAR | Status: DC | PRN
  Administered 2023-10-30 (×3): 50 ug via INTRAVENOUS
  Administered 2023-10-30: 100 ug via INTRAVENOUS

## 2023-10-30 MED ORDER — KETAMINE HCL 50 MG/5ML IJ SOSY
PREFILLED_SYRINGE | INTRAMUSCULAR | Status: AC
  Filled 2023-10-30: qty 5

## 2023-10-30 MED ORDER — PHENYLEPHRINE HCL-NACL 20-0.9 MG/250ML-% IV SOLN
INTRAVENOUS | Status: DC | PRN
  Administered 2023-10-30: 20 ug/min via INTRAVENOUS
  Administered 2023-10-30: 50 ug/min via INTRAVENOUS

## 2023-10-30 MED ORDER — HYDROMORPHONE HCL 1 MG/ML IJ SOLN
0.2500 mg | INTRAMUSCULAR | Status: DC | PRN
  Administered 2023-10-30 (×2): 0.5 mg via INTRAVENOUS

## 2023-10-30 MED ORDER — OXYCODONE HCL 5 MG/5ML PO SOLN: 5.0000 mg | Freq: Once | ORAL | Status: DC | PRN

## 2023-10-30 MED ORDER — METHOCARBAMOL 1000 MG/10ML IJ SOLN
1000.0000 mg | Freq: Three times a day (TID) | INTRAMUSCULAR | Status: DC
  Administered 2023-10-30 (×2): 1000 mg via INTRAVENOUS
  Filled 2023-10-30 (×3): qty 10

## 2023-10-30 MED ORDER — ONDANSETRON HCL 4 MG/2ML IJ SOLN
INTRAMUSCULAR | Status: DC | PRN
  Administered 2023-10-30: 4 mg via INTRAVENOUS

## 2023-10-30 MED ORDER — PHENYLEPHRINE HCL (PRESSORS) 10 MG/ML IV SOLN
INTRAVENOUS | Status: DC | PRN
  Administered 2023-10-30: 160 ug via INTRAVENOUS

## 2023-10-30 MED ORDER — SUGAMMADEX SODIUM 200 MG/2ML IV SOLN
INTRAVENOUS | Status: DC | PRN
  Administered 2023-10-30: 200 mg via INTRAVENOUS

## 2023-10-30 MED ORDER — ALBUMIN HUMAN 5 % IV SOLN
INTRAVENOUS | Status: AC
  Filled 2023-10-30: qty 250

## 2023-10-30 MED ORDER — HYDROMORPHONE HCL 1 MG/ML IJ SOLN
INTRAMUSCULAR | Status: AC
  Administered 2023-10-30: 0.5 mg via INTRAVENOUS
  Filled 2023-10-30: qty 2

## 2023-10-30 MED ORDER — SUCCINYLCHOLINE CHLORIDE 200 MG/10ML IV SOSY
PREFILLED_SYRINGE | INTRAVENOUS | Status: DC | PRN
  Administered 2023-10-30: 160 mg via INTRAVENOUS

## 2023-10-30 MED ORDER — OXYCODONE HCL 5 MG PO TABS: 5.0000 mg | ORAL_TABLET | Freq: Once | ORAL | Status: DC | PRN

## 2023-10-30 MED ORDER — LACTATED RINGERS IV SOLN: INTRAVENOUS | Status: DC | PRN

## 2023-10-30 MED ORDER — ACETAMINOPHEN 10 MG/ML IV SOLN
1000.0000 mg | Freq: Four times a day (QID) | INTRAVENOUS | Status: AC
  Administered 2023-10-30 – 2023-10-31 (×4): 1000 mg via INTRAVENOUS
  Filled 2023-10-30 (×3): qty 100

## 2023-10-30 MED ORDER — PROPOFOL 10 MG/ML IV BOLUS
INTRAVENOUS | Status: DC | PRN
  Administered 2023-10-30: 200 mg via INTRAVENOUS

## 2023-10-30 MED ORDER — AMISULPRIDE (ANTIEMETIC) 5 MG/2ML IV SOLN: 10.0000 mg | Freq: Once | INTRAVENOUS | Status: DC | PRN

## 2023-10-30 MED ORDER — LIDOCAINE HCL (PF) 2 % IJ SOLN
INTRAMUSCULAR | Status: DC | PRN
  Administered 2023-10-30: 1.5 mg/kg/h via INTRADERMAL

## 2023-10-30 MED ORDER — STERILE WATER FOR IRRIGATION IR SOLN
Status: DC | PRN
  Administered 2023-10-30: 500 mL

## 2023-10-30 MED ORDER — ONDANSETRON HCL 4 MG/2ML IJ SOLN: 4.0000 mg | Freq: Once | INTRAMUSCULAR | Status: DC | PRN

## 2023-10-30 MED ORDER — LIDOCAINE HCL (CARDIAC) PF 100 MG/5ML IV SOSY
PREFILLED_SYRINGE | INTRAVENOUS | Status: DC | PRN
  Administered 2023-10-30: 100 mg via INTRAVENOUS

## 2023-10-30 MED ORDER — INSULIN ASPART 100 UNIT/ML IJ SOLN
0.0000 [IU] | INTRAMUSCULAR | Status: DC | PRN
  Administered 2023-10-30: 4 [IU] via SUBCUTANEOUS

## 2023-10-30 MED ORDER — 0.9 % SODIUM CHLORIDE (POUR BTL) OPTIME
TOPICAL | Status: DC | PRN
  Administered 2023-10-30: 2000 mL
  Administered 2023-10-30: 1000 mL

## 2023-10-30 MED ORDER — SODIUM CHLORIDE 0.9 % IV SOLN: INTRAVENOUS | Status: DC | PRN

## 2023-10-30 MED ORDER — ALBUMIN HUMAN 5 % IV SOLN: INTRAVENOUS | Status: DC | PRN

## 2023-10-30 MED ORDER — HYDROMORPHONE HCL 1 MG/ML IJ SOLN
0.5000 mg | INTRAMUSCULAR | Status: DC | PRN
Start: 2023-10-30 — End: 2023-11-04
  Administered 2023-10-30 – 2023-11-03 (×5): 1 mg via INTRAVENOUS
  Filled 2023-10-30 (×5): qty 1

## 2023-10-30 MED ORDER — INSULIN ASPART 100 UNIT/ML IJ SOLN
0.0000 [IU] | Freq: Four times a day (QID) | INTRAMUSCULAR | Status: DC
  Administered 2023-10-30: 2 [IU] via SUBCUTANEOUS
  Administered 2023-10-31 – 2023-11-02 (×7): 1 [IU] via SUBCUTANEOUS

## 2023-10-30 SURGICAL SUPPLY — 61 items
APL SWBSTK 6 STRL LF DISP (MISCELLANEOUS) ×2
APPLICATOR COTTON TIP 6 STRL (MISCELLANEOUS) ×2 IMPLANT
APPLICATOR COTTON TIP 6IN STRL (MISCELLANEOUS) ×2
BAG COUNTER SPONGE SURGICOUNT (BAG) IMPLANT
BAG SPNG CNTER NS LX DISP (BAG)
BLADE EXTENDED COATED 6.5IN (ELECTRODE) IMPLANT
CATH ROBINSON RED A/P 14FR (CATHETERS) IMPLANT
CELLS DAT CNTRL 66122 CELL SVR (MISCELLANEOUS) IMPLANT
CLIP TI LARGE 6 (CLIP) IMPLANT
COVER SURGICAL LIGHT HANDLE (MISCELLANEOUS) ×2 IMPLANT
DRSG OPSITE POSTOP 4X10 (GAUZE/BANDAGES/DRESSINGS) IMPLANT
DRSG OPSITE POSTOP 4X12 (GAUZE/BANDAGES/DRESSINGS) IMPLANT
DRSG OPSITE POSTOP 4X8 (GAUZE/BANDAGES/DRESSINGS) IMPLANT
ELECT REM PT RETURN 15FT ADLT (MISCELLANEOUS) ×1 IMPLANT
G-TUBE MIC BOLUS 22FR ENFIT (TUBING) IMPLANT
GAUZE PAD ABD 8X10 STRL (GAUZE/BANDAGES/DRESSINGS) IMPLANT
GAUZE SPONGE 4X4 12PLY STRL (GAUZE/BANDAGES/DRESSINGS) ×1 IMPLANT
GLOVE BIO SURGEON STRL SZ7 (GLOVE) ×2 IMPLANT
GLOVE BIOGEL PI IND STRL 7.0 (GLOVE) ×1 IMPLANT
GLOVE BIOGEL PI IND STRL 7.5 (GLOVE) ×2 IMPLANT
GOWN STRL REUS W/ TWL LRG LVL3 (GOWN DISPOSABLE) ×2 IMPLANT
GOWN STRL REUS W/TWL LRG LVL3 (GOWN DISPOSABLE) ×2
HANDLE SUCTION POOLE (INSTRUMENTS) IMPLANT
KIT TURNOVER KIT A (KITS) IMPLANT
LIGASURE IMPACT 36 18CM CVD LR (INSTRUMENTS) IMPLANT
NS IRRIG 1000ML POUR BTL (IV SOLUTION) ×2 IMPLANT
PACK COLON (CUSTOM PROCEDURE TRAY) ×1 IMPLANT
PENCIL SMOKE EVACUATOR (MISCELLANEOUS) ×1 IMPLANT
RELOAD PROXIMATE 75MM BLUE (ENDOMECHANICALS) IMPLANT
RELOAD STAPLE 75 3.8 BLU REG (ENDOMECHANICALS) IMPLANT
RETRACTOR WND ALEXIS 18 MED (MISCELLANEOUS) IMPLANT
RETRACTOR WND ALEXIS 25 LRG (MISCELLANEOUS) IMPLANT
RETRACTOR WOUND ALXS 34CM XLRG (MISCELLANEOUS) IMPLANT
RTRCTR WOUND ALEXIS 18CM MED (MISCELLANEOUS)
RTRCTR WOUND ALEXIS 25CM LRG (MISCELLANEOUS) ×1
RTRCTR WOUND ALEXIS 34CM XLRG (MISCELLANEOUS) ×1
SHEARS HARMONIC 36 ACE (MISCELLANEOUS) IMPLANT
SPONGE T-LAP 18X18 ~~LOC~~+RFID (SPONGE) IMPLANT
STAPLER GUN LINEAR PROX 60 (STAPLE) IMPLANT
STAPLER PROXIMATE 75MM BLUE (STAPLE) IMPLANT
STAPLER SKIN PROX WIDE 3.9 (STAPLE) ×1 IMPLANT
SUCTION POOLE HANDLE (INSTRUMENTS)
SUT ETHILON 2 0 PS N (SUTURE) IMPLANT
SUT NOV 1 T60/GS (SUTURE) IMPLANT
SUT NOVA NAB DX-16 0-1 5-0 T12 (SUTURE) IMPLANT
SUT NOVA T20/GS 25 (SUTURE) IMPLANT
SUT PDS AB 1 TP1 96 (SUTURE) ×2 IMPLANT
SUT SILK 2 0 (SUTURE) ×1
SUT SILK 2 0 SH CR/8 (SUTURE) ×1 IMPLANT
SUT SILK 2 0SH CR/8 30 (SUTURE) IMPLANT
SUT SILK 2-0 18XBRD TIE 12 (SUTURE) ×1 IMPLANT
SUT SILK 2-0 30XBRD TIE 12 (SUTURE) IMPLANT
SUT SILK 3 0 (SUTURE) ×2
SUT SILK 3 0 SH CR/8 (SUTURE) ×1 IMPLANT
SUT SILK 3-0 18XBRD TIE 12 (SUTURE) ×2 IMPLANT
SUT VIC AB 3-0 SH 18 (SUTURE) IMPLANT
TOWEL OR 17X26 10 PK STRL BLUE (TOWEL DISPOSABLE) IMPLANT
TRAY FOLEY MTR SLVR 16FR STAT (SET/KITS/TRAYS/PACK) ×1 IMPLANT
TUBE GASTRO BOLUS 22FR ENFIT (TUBING) ×1 IMPLANT
TUBING CONNECTING 10 (TUBING) ×2 IMPLANT
WATER STERILE IRR 1000ML POUR (IV SOLUTION) IMPLANT

## 2023-10-30 NOTE — Consult Note (Signed)
[facility] ADMISSION NOTE  Patient Care Team: Combs, Prince Solian, DO as PCP - General (Geriatric Medicine) Kerin Salen, MD as Consulting Physician (Gastroenterology)   ASSESSMENT & PLAN:  67 y.o.man with history of CAD, diabetes, HFeEF, hypertension, hyperlipidemia presenting for abdominal pain, vomiting, unintentional weight loss.  CT reported obstructive colon mass and intraoperatively found peritoneal carcinomatosis. The primary cancer is not resectable as it involves the entire mesentery. 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  Discussed findings with Rosanne Ashing and his partner in the room today.  He has no family history, or personal history of malignancy.  His symptoms progressed rather quickly. Pathology showed signet ring features His clinical presentation is most consistent with aggressive colon cancer.  He has significant peritoneal carcinomatosis.  This is unfortunately not curable.  Treatment will be palliative at best.  Systemic therapy with combination chemotherapy can be an option but will only be palliative.  Prognosis will be poor with signet ring features and carcinomatosis. His baseline performance status is borderline.  We talked about alternatively hospice to focus on comfort measures, symptom control is reasonable.  I recommend hospice consult.  Goals of care Recommend hospice care early to focus on comfort measures  Discharge planning Per surgical and primary team when ready.  All questions were answered. Please do not hesitate to call us back if any questions.   Melven Sartorius, MD 10/30/2023 1:55 PM   CHIEF COMPLAINTS/PURPOSE OF ADMISSION Abdominal pain  HISTORY OF PRESENTING ILLNESS:  [name] 67 y.o. male is admitted for abdominal pain.  Patient reports subacute onset of fatigue, abdominal pain.  He thinks he occurred over the past few weeks with worsening abdominal pain.  He denies any bloody stool  or dark stool.  Report episodes of diarrhea and vomiting.  He has decreased appetite for few months.  Rest of review of system as below.  10/28/23 ED presentation for abdominal pain, vomiting, unintentional weight loss.   10/28/23 CT of the chest abdomen pelvis:   1. Circumferential, irregular mural thickening involving the hepatic flexure of the colon suspicious for a annular colonic adenocarcinoma. Probable direct invasion of an adjacent loop of small bowel within the right upper quadrant and extensive mesenteric infiltration. Extensive pathologic adenopathy as described above. Possible isolated hepatic metastasis. 2. Resultant large bowel obstruction. 3. 16 mm hypodense lesion within the inferior right hepatic lobe, as noted above, suspicious for an isolated hepatic metastasis. This could be confirmed with contrast enhanced MRI examination. 4. Moderate multi-vessel coronary artery calcification. 5. Trace right pleural effusion. 6. Scattered subpleural pulmonary nodules all measuring less than 3 mm throughout the upper lobes bilaterally, likely post infectious or post inflammatory in nature. Close attention on subsequent surveillance examination is warranted. 7. 8.9 cm macroscopic fatty mass within the right posterior chest wall between the supraspinatus and trapezius musculature compatible with a chest wall lipoma.  10/29/23 CEA 17  10/30/23 Exploratory laparotomy, omental biopsy, open gastrostomy tube placement, creation of loop ileostomy. Surgeon:Matthew K Tsuei.  Intraoperatively findings: "Operative findings: The patient has significant free ascites throughout the abdomen. Then the lateral right lobe of the liver, there is a 2 cm palpable nodule and a smaller subcentimeter palpable nodule. There is a large mass involving the mesenteric border of the hepatic flexure of the colon extending down across the transverse colon mesentery down to its root. This mass extends across to the  ligament of Treitz. The cecum is dilated. The ileocecal valve is not  competent as we are able to milk contents from the cecum back into the terminal ileum. The small bowel was diffusely dilated but otherwise viable and free of any disease. "  "There is a fair amount of palpable tumor within the omentum. We took a large section of this and sent this for frozen section.  This revealed a diagnosis of poorly differentiated signet cell adenocarcinoma."    Summary of oncologic history as follows: Oncology History   No history exists.    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   Diabetes mellitus    Newly diagnosed 12/2011 with A1C of 9.6   Hyperlipidemia    Hypertension    PSVT (paroxysmal supraventricular tachycardia) (HCC)    Systolic CHF (HCC)    Diagnosed 12/2011 - possibly NICM felt secondary to hypertension and diabetes, given degree of LV dysfunction in the setting of single-vessel CAD    SURGICAL HISTORY: Past Surgical History:  Procedure Laterality Date   HERNIA REPAIR     LEFT AND RIGHT HEART CATHETERIZATION WITH CORONARY ANGIOGRAM N/A 12/27/2011   Procedure: LEFT AND RIGHT HEART CATHETERIZATION WITH CORONARY ANGIOGRAM;  Surgeon: Peter M Swaziland, MD;  Location: [facility] CATH LAB;  Service: Cardiovascular;  Laterality: N/A;   PERCUTANEOUS CORONARY STENT INTERVENTION (PCI-S)  12/27/2011   Procedure: PERCUTANEOUS CORONARY STENT INTERVENTION (PCI-S);  Surgeon: Peter M Swaziland, MD;  Location: [facility] CATH LAB;  Service: Cardiovascular;;   TONSILLECTOMY     VASECTOMY      SOCIAL HISTORY: Social History   Socioeconomic History   Marital status: Media planner     Spouse name: Currently has girlfriend   Number of children: Not on file   Years of education: Not on file   Highest education level: Not on file  Occupational History   Occupation: IT     Comment: at [employer]   Tobacco Use   Smoking status: Former    Current packs/day: 0.00    Average packs/day: 1.5 packs/day for 36.0 years (54.0 ttl pk-yrs)    Types: Cigarettes    Start date: 10/22/1966    Quit date: 10/22/2002    Years since quitting: 21.0   Smokeless tobacco: Never  Substance and Sexual Activity   Alcohol use: Yes    Alcohol/week: 24.0 standard drinks of alcohol    Types: 24 Cans of beer per week   Drug use: No   Sexual activity: Yes    Birth control/protection: Surgical  Other Topics Concern   Not on file  Social History Narrative   Not on file   Social Determinants of Health   Financial Resource Strain: Not on file  Food Insecurity: No Food Insecurity (10/29/2023)   Hunger Vital Sign    Worried About Running Out of Food in the Last Year: Never true    Ran Out of Food in the Last Year: Never true  Transportation Needs: No Transportation Needs (10/29/2023)   PRAPARE - Administrator, Civil Service (Medical): No    Lack of Transportation (Non-Medical): No  Physical Activity: Not on file  Stress: Not on file  Social Connections: Unknown (05/05/2022)  Received [facility]   Social Network    Social Network: Not on file  Intimate Partner Violence: Not At Risk (10/29/2023)   Humiliation, Afraid, Rape, and Kick questionnaire    Fear of Current or Ex-Partner: No    Emotionally Abused: No    Physically Abused: No    Sexually Abused: No    FAMILY HISTORY: Family History  Problem Relation Age of Onset   Emphysema Mother    Heart disease Brother    Heart disease Maternal Grandmother    Heart disease Paternal Grandfather    Clotting disorder Brother    Cancer Paternal Grandfather     ALLERGIES:  has No Known Allergies.  MEDICATIONS:  Current  Facility-Administered Medications  Medication Dose Route Frequency Provider Last Rate Last Admin   0.9 % irrigation (POUR BTL)    PRN Manus Rudd, MD   1,000 mL at 10/30/23 1040   amisulpride (BARHEMSYS) injection 10 mg  10 mg Intravenous Once PRN Lacy Duverney M, DO       bupivacaine liposome (EXPAREL) 1.3 % injection 266 mg  20 mL Infiltration Once Stechschulte, Hyman Hopes, MD       [MAR Hold] enoxaparin (LOVENOX) injection 50 mg  50 mg Subcutaneous Q24H Hall, Carole N, DO   50 mg at 10/29/23 1102   feeding supplement (ENSURE PRE-SURGERY) liquid 296 mL  296 mL Oral Once Stechschulte, Hyman Hopes, MD       HYDROmorphone (DILAUDID) injection 0.25-0.5 mg  0.25-0.5 mg Intravenous Q5 min PRN Lannie Fields, DO   0.5 mg at 10/30/23 1248   insulin aspart (novoLOG) injection 0-14 Units  0-14 Units Subcutaneous Q2H PRN Lannie Fields, DO   4 Units at 10/30/23 0807   [MAR Hold] insulin aspart (novoLOG) injection 0-9 Units  0-9 Units Subcutaneous Q4H Darlin Drop, DO   4 Units at 10/30/23 0948   [MAR Hold] ketorolac (TORADOL) 15 MG/ML injection 15 mg  15 mg Intravenous Q8H PRN Dow Adolph N, DO   15 mg at 10/29/23 2249   [MAR Hold] melatonin tablet 5 mg  5 mg Oral QHS PRN Anthoney Harada, NP   5 mg at 10/29/23 2249   ondansetron (ZOFRAN) injection 4 mg  4 mg Intravenous Once PRN Lacy Duverney M, DO       oxyCODONE (Oxy IR/ROXICODONE) immediate release tablet 5 mg  5 mg Oral Once PRN Lacy Duverney M, DO       Or   oxyCODONE (ROXICODONE) 5 MG/5ML solution 5 mg  5 mg Oral Once PRN Lannie Fields, DO       [MAR Hold] prochlorperazine (COMPAZINE) injection 5 mg  5 mg Intravenous Q6H PRN Dow Adolph N, DO       sodium chloride flush (NS) 0.9 % injection 10 mL  10 mL Intravenous Q12H Finucane, Elizabeth M, DO       sodium chloride flush (NS) 0.9 % injection 10 mL  10 mL Intravenous Q12H Lacy Duverney M, DO       sterile water for irrigation for irrigation    PRN Manus Rudd, MD   500 mL at 10/30/23 1030    REVIEW OF SYSTEMS:   Constitutional: Denies weight loss Respiratory: Denies cough, shortness of breath Cardiovascular: Denies chest pain, chest discomfort Gastrointestinal:  positive nausea, vomiting, diarrhea, and abdominal pain, no constipation or bloody stool Lymphatics: Denies lymphadenopathy or mass All other systems were reviewed with the patient and are negative.  PHYSICAL EXAMINATION: ECOG PERFORMANCE STATUS:  3 - Symptomatic, >50% confined to bed  Vitals:   10/30/23 1245 10/30/23 1300  BP: 137/72 129/71  Pulse: 72 71  Resp: 15 12  Temp:    SpO2: 100% 99%   Filed Weights   10/28/23 1630 10/29/23 1204  Weight: 230 lb (104.3 kg) 233 lb 0.4 oz (105.7 kg)    GENERAL: alert, no distress and comfortable SKIN: skin color normal. No jaundice EYES: normal, conjunctiva normal, sclera clear OROPHARYNX: no exudate, dry NECK: supple. No mass LYMPH:  no palpable cervical lymphadenopathy LUNGS: clear to auscultation and normal breathing effort.  No wheeze or rales HEART: regular rate & rhythm and no murmurs ABDOMEN: mid abdominal surgical wound. Ostomy. Distention Musculoskeletal:  no lower extremity edema NEURO: alert  LABORATORY DATA:  I have reviewed the data as listed Lab Results  Component Value Date   WBC 3.4 (L) 10/30/2023   HGB 8.0 (L) 10/30/2023   HCT 24.6 (L) 10/30/2023   MCV 92.5 10/30/2023   PLT 196 10/30/2023   Recent Labs    10/28/23 1640 10/29/23 0617 10/30/23 0556  NA 127* 127* 127*  K 3.8 3.5 3.4*  CL 94* 98 95*  CO2 22 19* 22  GLUCOSE 135* 110* 220*  BUN 44* 38* 30*  CREATININE 1.00 0.87 0.93  CALCIUM 9.3 8.8* 8.8*  GFRNONAA >60 >60 >60  PROT 7.8  --   --   ALBUMIN 3.5  --   --   AST 26  --   --   ALT 27  --   --   ALKPHOS 78  --   --   BILITOT 1.5*  --   --     RADIOGRAPHIC STUDIES: I have personally reviewed the radiological images as listed and agreed with the findings in the report. CT  CHEST ABDOMEN PELVIS W CONTRAST  Result Date: 10/28/2023 CLINICAL DATA:  Unintentional weight loss, unspecified abdominal pain, diarrhea EXAM: CT CHEST, ABDOMEN, AND PELVIS WITH CONTRAST TECHNIQUE: Multidetector CT imaging of the chest, abdomen and pelvis was performed following the standard protocol during bolus administration of intravenous contrast. RADIATION DOSE REDUCTION: This exam was performed according to the departmental dose-optimization program which includes automated exposure control, adjustment of the mA and/or kV according to patient size and/or use of iterative reconstruction technique. CONTRAST:  OMNIPAQUE IOHEXOL 300 MG/ML  SOLN COMPARISON:  None Available. FINDINGS: CT CHEST FINDINGS Cardiovascular: Moderate multi-vessel coronary artery calcification. Global cardiac size is within normal limits. No pericardial effusion. Central pulmonary arteries are enlarged in keeping with changes of pulmonary arterial hypertension. Mild atherosclerotic calcification within the thoracic aorta. No aortic aneurysm. Mediastinum/Nodes: No enlarged mediastinal, hilar, or axillary lymph nodes. Thyroid gland, trachea, and esophagus demonstrate no significant findings. Lungs/Pleura: Mild emphysema. Trace right pleural effusion. Scattered subpleural pulmonary nodules all measuring less than 3 mm are seen throughout the upper lobes bilaterally, likely post infectious or post inflammatory in nature. No confluent pulmonary infiltrate. No pneumothorax. No central obstructing lesion. Musculoskeletal: No acute bone abnormality. No lytic or blastic bone lesion. AA 5.4 x 8.9 cm macroscopic fatty mass is seen within the right posterior chest wall between the supraspinatus and trapezius musculature compatible with a chest wall lipoma. Coarse central calcification noted. CT ABDOMEN PELVIS FINDINGS Hepatobiliary: A 16 mm hypodense lesion is seen within the inferior right hepatic lobe at axial image # 71/2, indeterminate.  Isolated hepatic metastasis is not excluded. Liver otherwise unremarkable. Gallbladder unremarkable. No intra or extrahepatic biliary ductal dilation. Pancreas: Unremarkable unremarkable Spleen: Normal in  size without focal abnormality. Adrenals/Urinary Tract: Adrenal glands are unremarkable. Kidneys are normal, without renal calculi, focal lesion, or hydronephrosis. Bladder is unremarkable. Stomach/Bowel: There is circumferential, irregular mural thickening involving the hepatic flexure of the colon, best seen on axial image # 70/2 and sagittal image # 75/10 suspicious for a annular colonic adenocarcinoma. The mass demonstrates infiltration into the pericolonic soft tissues in keeping with transmural extension and there is obliteration of the intervening fat plane with the adjacent small bowel, best seen on coronal image # 120/8 and axial image # 70/2 suggesting direct invasion of the structure. There is extensive infiltration within the adjacent colonic mesentery. There is extensive pathologic mesenteric, periceliac, periportal, aortocaval, and left periaortic lymphadenopathy. Index lymph node within the periportal lymph node groups measures 3.2 x 6.5 cm at axial image # 59/2. There is a resultant large bowel obstruction with fluid-filled dilated loops of terminal small bowel as well as the cecum. No free intraperitoneal gas or fluid. Stomach and proximal small bowel as well as the distal colon are unremarkable. Vascular/Lymphatic: Extensive aortoiliac atherosclerotic calcification. No aortic aneurysm. Extensive pathologic adenopathy as described above. Reproductive: Prostate is unremarkable. Other: No abdominal wall hernia Musculoskeletal: No acute bone abnormality. No lytic or blastic bone lesion. IMPRESSION: 1. Circumferential, irregular mural thickening involving the hepatic flexure of the colon suspicious for a annular colonic adenocarcinoma. Probable direct invasion of an adjacent loop of small bowel within  the right upper quadrant and extensive mesenteric infiltration. Extensive pathologic adenopathy as described above. Possible isolated hepatic metastasis. 2. Resultant large bowel obstruction. 3. 16 mm hypodense lesion within the inferior right hepatic lobe, as noted above, suspicious for an isolated hepatic metastasis. This could be confirmed with contrast enhanced MRI examination. 4. Moderate multi-vessel coronary artery calcification. 5. Trace right pleural effusion. 6. Scattered subpleural pulmonary nodules all measuring less than 3 mm throughout the upper lobes bilaterally, likely post infectious or post inflammatory in nature. Close attention on subsequent surveillance examination is warranted. 7. 8.9 cm macroscopic fatty mass within the right posterior chest wall between the supraspinatus and trapezius musculature compatible with a chest wall lipoma. Aortic Atherosclerosis (ICD10-I70.0) and Emphysema (ICD10-J43.9). Electronically Signed   By: Helyn Numbers M.D.   On: 10/28/2023 23:07

## 2023-10-30 NOTE — Transfer of Care (Signed)
Immediate Anesthesia Transfer of Care Note  Patient: [name]  Procedure(s) Performed: Exploratory laparotomy; loop ileostomy creation; open g-tube placement; omentum biopsy  Patient Location: PACU  Anesthesia Type:General  Level of Consciousness: awake, drowsy, and patient cooperative  Airway & Oxygen Therapy: Patient Spontanous Breathing and Patient connected to face mask oxygen  Post-op Assessment: Report given to RN and Post -op Vital signs reviewed and stable  Post vital signs: Reviewed and stable  Last Vitals:  Vitals Value Taken Time  BP    Temp 36.6 C 10/30/23 1148  Pulse 75 10/30/23 1154  Resp 16 10/30/23 1154  SpO2 96 % 10/30/23 1154  Vitals shown include unfiled device data.  Last Pain:  Vitals:   10/30/23 0436  TempSrc: Oral  PainSc:       Patients Stated Pain Goal: 0 (10/29/23 2249)  Complications: No notable events documented.

## 2023-10-30 NOTE — Progress Notes (Signed)
Day of Surgery   Subjective/Chief Complaint: Unable to tolerate much bowel prep yesterday Has been marked by WOCN No complaints Hgb 8.0 this morning   Objective: Vital signs in last 24 hours: Temp:  [97.4 F (36.3 C)-98.4 F (36.9 C)] 98.4 F (36.9 C) (11/07 0436) Pulse Rate:  [73-90] 73 (11/07 0436) Resp:  [14-23] 14 (11/07 0436) BP: (115-156)/(54-73) 119/67 (11/07 0436) SpO2:  [97 %-100 %] 98 % (11/07 0436) Weight:  [105.7 kg] 105.7 kg (11/06 1204) Last BM Date : 10/28/23  Intake/Output from previous day: 11/06 0701 - 11/07 0700 In: 372.9 [I.V.:372.9] Out: -  Intake/Output this shift: No intake/output data recorded.  Vague RUQ discomfort   Lab Results:  Recent Labs    10/29/23 0617 10/30/23 0556  WBC 3.3* 3.4*  HGB 7.6* 8.0*  HCT 22.1* 24.6*  PLT 187 196   BMET Recent Labs    10/29/23 0617 10/30/23 0556  NA 127* 127*  K 3.5 3.4*  CL 98 95*  CO2 19* 22  GLUCOSE 110* 220*  BUN 38* 30*  CREATININE 0.87 0.93  CALCIUM 8.8* 8.8*   PT/INR No results for input(s): "LABPROT", "INR" in the last 72 hours. ABG No results for input(s): "PHART", "HCO3" in the last 72 hours.  Invalid input(s): "PCO2", "PO2"  Studies/Results: CT CHEST ABDOMEN PELVIS W CONTRAST  Result Date: 10/28/2023 CLINICAL DATA:  Unintentional weight loss, unspecified abdominal pain, diarrhea EXAM: CT CHEST, ABDOMEN, AND PELVIS WITH CONTRAST TECHNIQUE: Multidetector CT imaging of the chest, abdomen and pelvis was performed following the standard protocol during bolus administration of intravenous contrast. RADIATION DOSE REDUCTION: This exam was performed according to the departmental dose-optimization program which includes automated exposure control, adjustment of the mA and/or kV according to patient size and/or use of iterative reconstruction technique. CONTRAST:  OMNIPAQUE IOHEXOL 300 MG/ML  SOLN COMPARISON:  None Available. FINDINGS: CT CHEST FINDINGS Cardiovascular: Moderate  multi-vessel coronary artery calcification. Global cardiac size is within normal limits. No pericardial effusion. Central pulmonary arteries are enlarged in keeping with changes of pulmonary arterial hypertension. Mild atherosclerotic calcification within the thoracic aorta. No aortic aneurysm. Mediastinum/Nodes: No enlarged mediastinal, hilar, or axillary lymph nodes. Thyroid gland, trachea, and esophagus demonstrate no significant findings. Lungs/Pleura: Mild emphysema. Trace right pleural effusion. Scattered subpleural pulmonary nodules all measuring less than 3 mm are seen throughout the upper lobes bilaterally, likely post infectious or post inflammatory in nature. No confluent pulmonary infiltrate. No pneumothorax. No central obstructing lesion. Musculoskeletal: No acute bone abnormality. No lytic or blastic bone lesion. AA 5.4 x 8.9 cm macroscopic fatty mass is seen within the right posterior chest wall between the supraspinatus and trapezius musculature compatible with a chest wall lipoma. Coarse central calcification noted. CT ABDOMEN PELVIS FINDINGS Hepatobiliary: A 16 mm hypodense lesion is seen within the inferior right hepatic lobe at axial image # 71/2, indeterminate. Isolated hepatic metastasis is not excluded. Liver otherwise unremarkable. Gallbladder unremarkable. No intra or extrahepatic biliary ductal dilation. Pancreas: Unremarkable unremarkable Spleen: Normal in size without focal abnormality. Adrenals/Urinary Tract: Adrenal glands are unremarkable. Kidneys are normal, without renal calculi, focal lesion, or hydronephrosis. Bladder is unremarkable. Stomach/Bowel: There is circumferential, irregular mural thickening involving the hepatic flexure of the colon, best seen on axial image # 70/2 and sagittal image # 75/10 suspicious for a annular colonic adenocarcinoma. The mass demonstrates infiltration into the pericolonic soft tissues in keeping with transmural extension and there is obliteration  of the intervening fat plane with the adjacent small bowel, best  seen on coronal image # 120/8 and axial image # 70/2 suggesting direct invasion of the structure. There is extensive infiltration within the adjacent colonic mesentery. There is extensive pathologic mesenteric, periceliac, periportal, aortocaval, and left periaortic lymphadenopathy. Index lymph node within the periportal lymph node groups measures 3.2 x 6.5 cm at axial image # 59/2. There is a resultant large bowel obstruction with fluid-filled dilated loops of terminal small bowel as well as the cecum. No free intraperitoneal gas or fluid. Stomach and proximal small bowel as well as the distal colon are unremarkable. Vascular/Lymphatic: Extensive aortoiliac atherosclerotic calcification. No aortic aneurysm. Extensive pathologic adenopathy as described above. Reproductive: Prostate is unremarkable. Other: No abdominal wall hernia Musculoskeletal: No acute bone abnormality. No lytic or blastic bone lesion. IMPRESSION: 1. Circumferential, irregular mural thickening involving the hepatic flexure of the colon suspicious for a annular colonic adenocarcinoma. Probable direct invasion of an adjacent loop of small bowel within the right upper quadrant and extensive mesenteric infiltration. Extensive pathologic adenopathy as described above. Possible isolated hepatic metastasis. 2. Resultant large bowel obstruction. 3. 16 mm hypodense lesion within the inferior right hepatic lobe, as noted above, suspicious for an isolated hepatic metastasis. This could be confirmed with contrast enhanced MRI examination. 4. Moderate multi-vessel coronary artery calcification. 5. Trace right pleural effusion. 6. Scattered subpleural pulmonary nodules all measuring less than 3 mm throughout the upper lobes bilaterally, likely post infectious or post inflammatory in nature. Close attention on subsequent surveillance examination is warranted. 7. 8.9 cm macroscopic fatty mass  within the right posterior chest wall between the supraspinatus and trapezius musculature compatible with a chest wall lipoma. Aortic Atherosclerosis (ICD10-I70.0) and Emphysema (ICD10-J43.9). Electronically Signed   By: Helyn Numbers M.D.   On: 10/28/2023 23:07    Anti-infectives: Anti-infectives (From admission, onward)    Start     Dose/Rate Route Frequency Ordered Stop   10/30/23 0600  cefoTEtan (CEFOTAN) 2 g in sodium chloride 0.9 % 100 mL IVPB        2 g 200 mL/hr over 30 Minutes Intravenous On call to O.R. 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   Plan partial colectomy, possible colostomy, possible small bowel resection, possible liver biopsy today.  The surgical procedure has been discussed with the patient.  Potential risks, benefits, alternative treatments, and expected outcomes have been explained.  All of the patient's questions at this time have been answered.  The likelihood of reaching the patient's treatment goal is good.  The patient understand the proposed surgical procedure and wishes to proceed.  The patient may need blood transfusion during surgery, as he is starting with a hgb 8.0.  LOS: 1 day    Wynona Luna 10/30/2023

## 2023-10-30 NOTE — Anesthesia Procedure Notes (Signed)
Procedure Name: Intubation Date/Time: 10/30/2023 9:15 AM  Performed by: Chinita Pester, CRNAPre-anesthesia Checklist: Patient identified, Emergency Drugs available, Suction available and Patient being monitored Patient Re-evaluated:Patient Re-evaluated prior to induction Oxygen Delivery Method: Circle System Utilized Preoxygenation: Pre-oxygenation with 100% oxygen Induction Type: IV induction and Rapid sequence Laryngoscope Size: Mac and 4 Grade View: Grade I Tube type: Oral Tube size: 7.0 mm Number of attempts: 1 Airway Equipment and Method: Stylet and Oral airway Placement Confirmation: ETT inserted through vocal cords under direct vision, positive ETCO2 and breath sounds checked- equal and bilateral Secured at: 21 cm Tube secured with: Tape Dental Injury: Teeth and Oropharynx as per pre-operative assessment

## 2023-10-30 NOTE — Progress Notes (Signed)
   10/30/23 2033  TOC Brief Assessment  Insurance and Status Reviewed  Patient has primary care physician Yes (Combs, Prince Solian, DO)  Home environment has been reviewed Yes (Home with significant other)  Prior level of function: Independent  Prior/Current Home Services No current home services  Social Determinants of Health Reivew SDOH reviewed no interventions necessary  Readmission risk has been reviewed Yes  Transition of care needs no transition of care needs at this time

## 2023-10-30 NOTE — Plan of Care (Signed)
  Problem: Metabolic: Goal: Ability to maintain appropriate glucose levels will improve Outcome: Progressing   Problem: Nutritional: Goal: Maintenance of adequate nutrition will improve Outcome: Progressing   Problem: Clinical Measurements: Goal: Will remain free from infection Outcome: Progressing   Problem: Elimination: Goal: Will not experience complications related to bowel motility Outcome: Progressing   Problem: Pain Management: Goal: General experience of comfort will improve Outcome: Progressing

## 2023-10-30 NOTE — Anesthesia Preprocedure Evaluation (Addendum)
Anesthesia Evaluation  Patient identified by MRN, date of birth, ID band Patient awake    Reviewed: Allergy & Precautions, NPO status , Patient's Chart, lab work & pertinent test results, reviewed documented beta blocker date and time   Airway Mallampati: III  TM Distance: >3 FB Neck ROM: Full    Dental  (+) Poor Dentition, Dental Advisory Given   Pulmonary COPD, former smoker Quit smoking 2013, 54 pack year history    Pulmonary exam normal breath sounds clear to auscultation       Cardiovascular hypertension (119/67 preop), Pt. on medications and Pt. on home beta blockers + CAD, + Past MI, + Cardiac Stents (PCI with a bare-metal stent in 2013) and +CHF (LVEF 45-50% at time of MI/BMS in 2013, has not had echo since then)  Normal cardiovascular exam Rhythm:Regular Rate:Normal  Echo 2013 - Left ventricle: The cavity size was mildly dilated. Wall    thickness was increased in a pattern of mild LVH. Systolic    function was mildly reduced. The estimated ejection    fraction was in the range of 45% to 50%. There was an    increased relative contribution of atrial contraction to    ventricular filling.  - Left atrium: The atrium was mildly to moderately dilated.     Neuro/Psych negative neurological ROS  negative psych ROS   GI/Hepatic Neg liver ROS,,, progressive diffuse abdominal pain, distention, vomiting and unintentional weight loss.   CT chest abdomen pelvis revealed findings suspicious for annular colonic adenocarcinoma, probable direct invasion of an adjacent loop of small bowel within the right upper quadrant and extensive mesenteric infiltration, extensive pathologic adenopathy, possible isolated hepatic metastasis, resultant large bowel obstruction.  Scattered subpleural pulmonary nodules all measuring less than 3 mm likely postinfectious or postinflammatory in nature.    Endo/Other  diabetes, Well Controlled, Type 2,  Oral Hypoglycemic Agents  BMI 33 A1c 6.3 FS 202 in preop- treated per our protocol  Renal/GU negative Renal ROS  negative genitourinary   Musculoskeletal  (+) Arthritis , Osteoarthritis,    Abdominal  (+) + obese  Peds  Hematology  (+) Blood dyscrasia, anemia Hb 8   Anesthesia Other Findings Ozempic LD:   Reproductive/Obstetrics negative OB ROS                             Anesthesia Physical Anesthesia Plan  ASA: 3  Anesthesia Plan: General   Post-op Pain Management: Ofirmev IV (intra-op)* and Ketamine IV*   Induction: Intravenous and Rapid sequence  PONV Risk Score and Plan: 3 and Ondansetron, Dexamethasone, Midazolam and Treatment may vary due to age or medical condition  Airway Management Planned: Oral ETT  Additional Equipment: Arterial line  Intra-op Plan:   Post-operative Plan: Extubation in OR  Informed Consent: I have reviewed the patients History and Physical, chart, labs and discussed the procedure including the risks, benefits and alternatives for the proposed anesthesia with the patient or authorized representative who has indicated his/her understanding and acceptance.     Dental advisory given  Plan Discussed with: CRNA  Anesthesia Plan Comments: (Crossmatch x 2 units, starting Hb 8 in the setting of cardiac disease)        Anesthesia Quick Evaluation

## 2023-10-30 NOTE — Consult Note (Signed)
WOC Nurse requested for preoperative stoma site marking  Discussed surgical procedure and stoma creation with patient and family.  Explained role of the WOC nurse team.  Provided the patient with educational booklet and provided samples of pouching options.  Answered patient and family questions.   Examined patient lying, sitting, and standing in order to place the marking in the patient's visual field, away from any creases or abdominal contour issues and within the rectus muscle.  Attempted to mark below the patient's belt line however patient states he wears his pants fairly low.   Marked for colostomy in the LLQ 3 cm to the left of the umbilicus and 6 cm below the umbilicus.  Marked for ileostomy in the RLQ  3 cm to the right of the umbilicus and  6  cm below the umbilicus.   Patient's abdomen cleansed with CHG wipes at site markings, allowed to air dry prior to marking.Covered mark with thin film transparent dressing to preserve mark until date of surgery.   WOC Nurse team will follow up with patient after surgery for continue ostomy care and teaching.   Thank you,    Priscella Mann MSN, RN-BC, [employer] [phone]

## 2023-10-30 NOTE — Anesthesia Procedure Notes (Signed)
Arterial Line Insertion Start/End11/06/2023 9:15 AM, 10/30/2023 9:20 AM Performed by: Lannie Fields, DO, anesthesiologist  Patient location: Pre-op. Preanesthetic checklist: patient identified, IV checked, site marked, risks and benefits discussed, surgical consent, monitors and equipment checked, pre-op evaluation, timeout performed and anesthesia consent Lidocaine 1% used for infiltration Left, radial was placed Catheter size: 20 G Hand hygiene performed  and maximum sterile barriers used   Attempts: 1 Procedure performed without using ultrasound guided technique. Following insertion, dressing applied. Post procedure assessment: normal and unchanged  Patient tolerated the procedure well with no immediate complications.

## 2023-10-30 NOTE — Anesthesia Postprocedure Evaluation (Signed)
Anesthesia Post Note  Patient: [name]  Procedure(s) Performed: Exploratory laparotomy; loop ileostomy creation; open g-tube placement; omentum biopsy     Patient location during evaluation: PACU Anesthesia Type: General Level of consciousness: awake and alert, oriented and patient cooperative Pain management: pain level controlled Vital Signs Assessment: post-procedure vital signs reviewed and stable Respiratory status: spontaneous breathing, nonlabored ventilation and respiratory function stable Cardiovascular status: blood pressure returned to baseline and stable Postop Assessment: no apparent nausea or vomiting Anesthetic complications: no   No notable events documented.  Last Vitals:  Vitals:   10/30/23 0436 10/30/23 1148  BP: 119/67   Pulse: 73 75  Resp: 14 12  Temp: 36.9 C 36.6 C  SpO2: 98% 100%    Last Pain:  Vitals:   10/30/23 1215  TempSrc:   PainSc: 8                  Lannie Fields

## 2023-10-30 NOTE — Progress Notes (Addendum)
[facility] PROGRESS NOTE   [name] HKV:[id number] DOB: 1956/07/14 DOA: 10/28/2023  PCP: Theodis Shove, DO  Brief History: 67 y.o.m w/ CAD s/p PCI with a bare-metal stent in 2013, HFrEF, type 2 diabetes, hyperlipidemia, obesity, who presented to the ED, referred to by GI due to progressive diffuse abdominal pain, distention, vomiting and unintentional weight loss x 2 weeks.  He underwent CT scan of his abdomen pelvis which raised concern for colon cancer with bowel obstruction.  He was hospitalized for further management.  Consultants: General surgery.  Gastroenterology.  Procedures: Plan is for colectomy today.    Subjective/Interval History: Denies any abdominal pain currently.  Some joint pains which is usual for him.  No nausea vomiting.  No shortness of breath.    Assessment/Plan:  Colonic mass with large bowel obstruction/possible metastatic cancer CT scan concerning for malignancy with metastatic process.  There was large bowel obstruction noted.  General surgery and gastroenterology were consulted.  Plan is for partial colectomy today.  Hyponatremia Reason for this is not entirely clear.  No urine osmolality has been sent yet.  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 Urine osmolality.  Check TSH cortisol levels.  Normocytic anemia May need transfusion.  Continue to monitor.  Hypokalemia/hypomagnesemia Continue to monitor.  Replete as necessary.  Metabolic acidosis Seems to have resolved.  Diabetes mellitus type 2 Continue SSI.  HbA1c 6.3.  Chronic systolic CHF No recent echocardiogram in our system.  From 2013 his LVEF is noted to be 45 to 50%. Seems to be well compensated currently. Home medication list showed that he was on diuretics, beta-blocker, ACE inhibitor, spironolactone.  Currently on hold due to his bowel obstruction.  Obesity Estimated body mass index is 33.44 kg/m as  calculated from the following:   Height as of this encounter: 5\' 10"  (1.778 m).   Weight as of this encounter: 105.7 kg.   DVT Prophylaxis: Lovenox Code Status: Full code Family Communication: Discussed with patient Disposition Plan: To be determined  Status is: Inpatient Remains inpatient appropriate because: Bowel obstruction due to colonic mass      Medications: Scheduled:  bupivacaine liposome  20 mL Infiltration Once   [MAR Hold] enoxaparin (LOVENOX) injection  50 mg Subcutaneous Q24H   feeding supplement  296 mL Oral Once   [MAR Hold] insulin aspart  0-9 Units Subcutaneous Q4H   sodium chloride flush  10 mL Intravenous Q12H   Continuous:  cefoTEtan (CEFOTAN) IV     FIE:PPIRJJO aspart, [MAR Hold] ketorolac, [MAR Hold] melatonin, [MAR Hold] prochlorperazine  Antibiotics: Anti-infectives (From admission, onward)    Start     Dose/Rate Route Frequency Ordered Stop   10/30/23 0600  cefoTEtan (CEFOTAN) 2 g in sodium chloride 0.9 % 100 mL IVPB        2 g 200 mL/hr over 30 Minutes Intravenous On call to O.R. 10/29/23 1924 10/31/23 0559   10/29/23 1400  neomycin (MYCIFRADIN) tablet 1,000 mg       Placed in "And" Linked Group   1,000 mg Oral 3 times per day 10/29/23 1020 10/30/23 0000   10/29/23 1400  metroNIDAZOLE (FLAGYL) tablet 1,000 mg       Placed in "And" Linked Group   1,000 mg Oral 3 times per day 10/29/23 1020 10/30/23 0000       Objective:  Vital Signs  Vitals:   10/29/23 1626 10/29/23 2002 10/30/23 0005 10/30/23 0436  BP: (!) 140/73 130/62 Marland Kitchen)  156/65 119/67  Pulse: 79 79 90 73  Resp: 18 18  14   Temp: 97.8 F (36.6 C) 97.7 F (36.5 C) 97.8 F (36.6 C) 98.4 F (36.9 C)  TempSrc: Oral Oral Oral Oral  SpO2: 100% 98% 97% 98%  Weight:      Height:        Intake/Output Summary (Last 24 hours) at 10/30/2023 0905 Last data filed at 10/30/2023 0503 Gross per 24 hour  Intake 372.94 ml  Output --  Net 372.94 ml   Filed Weights   10/28/23 1630  10/29/23 1204  Weight: 104.3 kg 105.7 kg    General appearance: Awake alert.  In no distress Resp: Clear to auscultation bilaterally.  Normal effort Cardio: S1-S2 is normal regular.  No S3-S4.  No rubs murmurs or bruit GI: Abdomen is soft.  Distention is noted.  Nontender.  Bowel sounds absent. Extremities: No edema.   Neurologic: Alert and oriented x3.  No focal neurological deficits.    Lab Results:  Data Reviewed: I have personally reviewed following labs and reports of the imaging studies  CBC: Recent Labs  Lab 10/28/23 1640 10/29/23 0617 10/30/23 0556  WBC 5.1 3.3* 3.4*  HGB 8.7* 7.6* 8.0*  HCT 25.7* 22.1* 24.6*  MCV 91.1 90.6 92.5  PLT 224 187 196    Basic Metabolic Panel: Recent Labs  Lab 10/28/23 1640 10/29/23 0617 10/30/23 0556  NA 127* 127* 127*  K 3.8 3.5 3.4*  CL 94* 98 95*  CO2 22 19* 22  GLUCOSE 135* 110* 220*  BUN 44* 38* 30*  CREATININE 1.00 0.87 0.93  CALCIUM 9.3 8.8* 8.8*  MG  --  1.6* 1.9  PHOS  --  2.8  --     GFR: Estimated Creatinine Clearance: 92.6 mL/min (by C-G formula based on SCr of 0.93 mg/dL).  Liver Function Tests: Recent Labs  Lab 10/28/23 1640  AST 26  ALT 27  ALKPHOS 78  BILITOT 1.5*  PROT 7.8  ALBUMIN 3.5    Recent Labs  Lab 10/28/23 1640  LIPASE 31   HbA1C: Recent Labs    10/29/23 1439  HGBA1C 6.3*    CBG: Recent Labs  Lab 10/29/23 1628 10/29/23 2004 10/30/23 0000 10/30/23 0429 10/30/23 0724  GLUCAP 155* 131* 103* 98 202*    Recent Results (from the past 240 hour(s))  Surgical PCR screen     Status: None   Collection Time: 10/29/23  2:01 PM   Specimen: Nasal Mucosa; Nasal Swab  Result Value Ref Range Status   MRSA, PCR NEGATIVE NEGATIVE Final   Staphylococcus aureus NEGATIVE NEGATIVE Final    Comment: (NOTE) The Xpert SA Assay (FDA approved for NASAL specimens in patients 51 years of age and older), is one component of a comprehensive surveillance program. It is not intended to diagnose  infection nor to guide or monitor treatment. Performed at [facility], [address] [place], [place] [address]       Radiology Studies: CT CHEST ABDOMEN PELVIS W CONTRAST  Result Date: 10/28/2023 CLINICAL DATA:  Unintentional weight loss, unspecified abdominal pain, diarrhea EXAM: CT CHEST, ABDOMEN, AND PELVIS WITH CONTRAST TECHNIQUE: Multidetector CT imaging of the chest, abdomen and pelvis was performed following the standard protocol during bolus administration of intravenous contrast. RADIATION DOSE REDUCTION: This exam was performed according to the departmental dose-optimization program which includes automated exposure control, adjustment of the mA and/or kV according to patient size and/or use of iterative reconstruction technique. CONTRAST:  OMNIPAQUE  IOHEXOL 300 MG/ML  SOLN COMPARISON:  None Available. FINDINGS: CT CHEST FINDINGS Cardiovascular: Moderate multi-vessel coronary artery calcification. Global cardiac size is within normal limits. No pericardial effusion. Central pulmonary arteries are enlarged in keeping with changes of pulmonary arterial hypertension. Mild atherosclerotic calcification within the thoracic aorta. No aortic aneurysm. Mediastinum/Nodes: No enlarged mediastinal, hilar, or axillary lymph nodes. Thyroid gland, trachea, and esophagus demonstrate no significant findings. Lungs/Pleura: Mild emphysema. Trace right pleural effusion. Scattered subpleural pulmonary nodules all measuring less than 3 mm are seen throughout the upper lobes bilaterally, likely post infectious or post inflammatory in nature. No confluent pulmonary infiltrate. No pneumothorax. No central obstructing lesion. Musculoskeletal: No acute bone abnormality. No lytic or blastic bone lesion. AA 5.4 x 8.9 cm macroscopic fatty mass is seen within the right posterior chest wall between the supraspinatus and trapezius musculature compatible with a chest wall lipoma. Coarse central  calcification noted. CT ABDOMEN PELVIS FINDINGS Hepatobiliary: A 16 mm hypodense lesion is seen within the inferior right hepatic lobe at axial image # 71/2, indeterminate. Isolated hepatic metastasis is not excluded. Liver otherwise unremarkable. Gallbladder unremarkable. No intra or extrahepatic biliary ductal dilation. Pancreas: Unremarkable unremarkable Spleen: Normal in size without focal abnormality. Adrenals/Urinary Tract: Adrenal glands are unremarkable. Kidneys are normal, without renal calculi, focal lesion, or hydronephrosis. Bladder is unremarkable. Stomach/Bowel: There is circumferential, irregular mural thickening involving the hepatic flexure of the colon, best seen on axial image # 70/2 and sagittal image # 75/10 suspicious for a annular colonic adenocarcinoma. The mass demonstrates infiltration into the pericolonic soft tissues in keeping with transmural extension and there is obliteration of the intervening fat plane with the adjacent small bowel, best seen on coronal image # 120/8 and axial image # 70/2 suggesting direct invasion of the structure. There is extensive infiltration within the adjacent colonic mesentery. There is extensive pathologic mesenteric, periceliac, periportal, aortocaval, and left periaortic lymphadenopathy. Index lymph node within the periportal lymph node groups measures 3.2 x 6.5 cm at axial image # 59/2. There is a resultant large bowel obstruction with fluid-filled dilated loops of terminal small bowel as well as the cecum. No free intraperitoneal gas or fluid. Stomach and proximal small bowel as well as the distal colon are unremarkable. Vascular/Lymphatic: Extensive aortoiliac atherosclerotic calcification. No aortic aneurysm. Extensive pathologic adenopathy as described above. Reproductive: Prostate is unremarkable. Other: No abdominal wall hernia Musculoskeletal: No acute bone abnormality. No lytic or blastic bone lesion. IMPRESSION: 1. Circumferential, irregular  mural thickening involving the hepatic flexure of the colon suspicious for a annular colonic adenocarcinoma. Probable direct invasion of an adjacent loop of small bowel within the right upper quadrant and extensive mesenteric infiltration. Extensive pathologic adenopathy as described above. Possible isolated hepatic metastasis. 2. Resultant large bowel obstruction. 3. 16 mm hypodense lesion within the inferior right hepatic lobe, as noted above, suspicious for an isolated hepatic metastasis. This could be confirmed with contrast enhanced MRI examination. 4. Moderate multi-vessel coronary artery calcification. 5. Trace right pleural effusion. 6. Scattered subpleural pulmonary nodules all measuring less than 3 mm throughout the upper lobes bilaterally, likely post infectious or post inflammatory in nature. Close attention on subsequent surveillance examination is warranted. 7. 8.9 cm macroscopic fatty mass within the right posterior chest wall between the supraspinatus and trapezius musculature compatible with a chest wall lipoma. Aortic Atherosclerosis (ICD10-I70.0) and Emphysema (ICD10-J43.9). Electronically Signed   By: Helyn Numbers M.D.   On: 10/28/2023 23:07       LOS: 1 day   Shirlie Enck  Rito Ehrlich  [email or web address]  10/30/2023, 9:05 AM

## 2023-10-30 NOTE — Op Note (Signed)
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 Yvonna Alanis Anesthesia: General Endotracheal Indications: This is a 67 year old male who has not had a colonoscopy in the last 20+ years who presented with several weeks of vague abdominal pain, weight loss, diarrhea, and fatigue.  He presents to the emerged part for evaluation.  He is found to be anemic.  CT scan shows a large mass involving the hepatic flexure of the colon.  There is possible invasion of the tumor into an adjacent loop of small bowel in the right upper quadrant.  He had extensive lymphadenopathy throughout the mesentery.  There is possible evidence of hepatic metastases.  The tumors appear to be causing obstruction of the right colon.  We were consulted to see the patient.  Due to the obstruction, we recommended surgical resection.  The patient does not have a tissue diagnosis prior to surgery.  He is anemic and is being transfused by anesthesia.  Operative findings: The patient has significant free ascites throughout the abdomen.  Then the lateral right lobe of the liver, there is a 2 cm palpable nodule and a smaller subcentimeter palpable nodule.  There is a large mass involving the mesenteric border of the hepatic flexure of the colon extending down across the transverse colon mesentery down to its root.  This mass extends across to the ligament of Treitz.  The cecum is dilated.  The ileocecal valve is not competent as we are able to milk contents from the cecum back into the terminal ileum.  The small bowel was diffusely dilated but otherwise viable and free of any disease.  Description of procedure: The patient is  brought to the operating room placed in the supine position on the operating room table.  After an adequate level general anesthesia was obtained, a Foley catheter was placed under sterile technique.  Anesthesia placed an arterial line.  Transfusion was ongoing at the beginning of surgery.  The patient's abdomen was prepped with ChloraPrep and draped sterile fashion.  A timeout was taken to ensure the proper patient and proper procedure.  We made an upper midline incision down to just below his umbilicus.  The patient has a previous primary repair of an umbilical hernia.  We into the peritoneal cavity and encountered a large amount of ascites.  This was suctioned out.  We then explored his abdomen.  The small bowel is diffusely dilated but appears viable.  No sign of obstruction.  The cecum is quite dilated.  We examined the liver.  2 small nodules are palpated in the right lateral lobe of the liver.  There is a large tumor mass involving the hepatic flexure of the colon extending across the root of the mesentery to the ligament of Treitz.  This tumor erodes through the upper surface of the mesentery and is densely attached to the gallbladder and edge of the liver.  We were able to dissect the tumor off of the gallbladder.  We placed a Bookwalter retractor and thoroughly explored the abdomen.  We examined the transverse colon mesentery carefully.  The tumor seems to extend all the way down to its root with no clear tissue plane below the tumor.  This is a fairly wide tumor and extends across to the ligament of Treitz  on the left.  We ran the small bowel completely from the cecum back to the ligament of Treitz.  There are no signs of obstruction.  The small bowel was not involved in the tumor.  We were able to milk contents retrograde from the cecum through the ileocecal valve to the small bowel.  There is a fair amount of palpable tumor within the omentum.  We took a large section of this and sent this for frozen  section.  This revealed a diagnosis of poorly differentiated signet cell adenocarcinoma.  I have concerns that any growth in the tumor would cause an obstruction at the ligament of Treitz.  Therefore I made the decision to place the gastrostomy tube to keep his stomach decompressed.  The primary cancer is not resectable as it involves the entire mesentery.  Attempting to resect this would result in catastrophic bleeding.  The patient's ileocecal valve is not competent so we are able to use a loop ileostomy.  We placed a gastrostomy tube by selecting a site below the costal margin on the left.  A 20 French gastrostomy tube was inserted.  We made 2 concentric pursestring sutures of 3-0 silk in the fundus of the stomach.  We created a gastrotomy and inserted the gastrostomy tube.  The balloon was inflated.  The pursestring sutures were tied down.  We pulled the stomach up to the anterior abdominal wall.  This was further secured to the anterior abdominal wall with multiple 2-0 silk sutures.  We secured the flange of the gastrostomy tube to the skin with 2-0 Ethilon sutures  We then created a loop ileostomy by selecting a point about 15 cm proximal to the ileocecal valve.  We excised a circle of skin and dissected down to the fascia.  The fascia was opened in a cruciate fashion with cautery.  We brought out the loop ileostomy.  A 16 French red rubber catheter was passed through a small defect in the mesentery to help hold up the loop.  The red rubber catheter was secured with 2-0 Ethilon sutures.  We then irrigated the abdomen thoroughly and inspected for hemostasis.  We closed the fascia with double-stranded #1 PDS suture.  Staples were used to close the skin.  A sterile dressing is applied to the midline incision.  The gastrostomy tube is placed to straight drain.  I then matured the loop ileostomy by creating an small enterotomy.  We used 3-0 Vicryl sutures to matured the ileostomy.  An ostomy appliance was  cut to fit and was secured to the skin.  The patient is then extubated and brought to the recovery room in stable condition.  All sponge, instrument, and needle counts are correct.  Wilmon Arms. Corliss Skains, MD, [facility]  General Surgery   10/30/2023 11:38 AM

## 2023-10-31 ENCOUNTER — Encounter (HOSPITAL_COMMUNITY): Payer: Self-pay | Admitting: Surgery

## 2023-10-31 DIAGNOSIS — E871 Hypo-osmolality and hyponatremia: Secondary | ICD-10-CM | POA: Diagnosis not present

## 2023-10-31 DIAGNOSIS — K56609 Unspecified intestinal obstruction, unspecified as to partial versus complete obstruction: Secondary | ICD-10-CM | POA: Diagnosis not present

## 2023-10-31 DIAGNOSIS — C189 Malignant neoplasm of colon, unspecified: Secondary | ICD-10-CM | POA: Diagnosis not present

## 2023-10-31 LAB — POCT I-STAT, CHEM 8
BUN: 22 mg/dL (ref 8–23)
Calcium, Ion: 1.25 mmol/L (ref 1.15–1.40)
Chloride: 99 mmol/L (ref 98–111)
Creatinine, Ser: 0.8 mg/dL (ref 0.61–1.24)
Glucose, Bld: 182 mg/dL — ABNORMAL HIGH (ref 70–99)
HCT: 21 % — ABNORMAL LOW (ref 39.0–52.0)
Hemoglobin: 7.1 g/dL — ABNORMAL LOW (ref 13.0–17.0)
Potassium: 3.6 mmol/L (ref 3.5–5.1)
Sodium: 130 mmol/L — ABNORMAL LOW (ref 135–145)
TCO2: 21 mmol/L — ABNORMAL LOW (ref 22–32)

## 2023-10-31 LAB — POCT I-STAT 7, (LYTES, BLD GAS, ICA,H+H)
Acid-base deficit: 5 mmol/L — ABNORMAL HIGH (ref 0.0–2.0)
Acid-base deficit: 6 mmol/L — ABNORMAL HIGH (ref 0.0–2.0)
Bicarbonate: 20.9 mmol/L (ref 20.0–28.0)
Bicarbonate: 20.1 mmol/L (ref 20.0–28.0)
Calcium, Ion: 1.29 mmol/L (ref 1.15–1.40)
Calcium, Ion: 1.12 mmol/L — ABNORMAL LOW (ref 1.15–1.40)
HCT: 20 % — ABNORMAL LOW (ref 39.0–52.0)
HCT: 31 % — ABNORMAL LOW (ref 39.0–52.0)
Hemoglobin: 6.8 g/dL — CL (ref 13.0–17.0)
Hemoglobin: 10.5 g/dL — ABNORMAL LOW (ref 13.0–17.0)
O2 Saturation: 100 %
O2 Saturation: 100 %
Potassium: 3.6 mmol/L (ref 3.5–5.1)
Potassium: 4 mmol/L (ref 3.5–5.1)
Sodium: 130 mmol/L — ABNORMAL LOW (ref 135–145)
Sodium: 130 mmol/L — ABNORMAL LOW (ref 135–145)
TCO2: 22 mmol/L (ref 22–32)
TCO2: 21 mmol/L — ABNORMAL LOW (ref 22–32)
pCO2 arterial: 44.5 mm[Hg] (ref 32–48)
pCO2 arterial: 43.2 mm[Hg] (ref 32–48)
pH, Arterial: 7.279 — ABNORMAL LOW (ref 7.35–7.45)
pH, Arterial: 7.276 — ABNORMAL LOW (ref 7.35–7.45)
pO2, Arterial: 201 mm[Hg] — ABNORMAL HIGH (ref 83–108)
pO2, Arterial: 231 mm[Hg] — ABNORMAL HIGH (ref 83–108)

## 2023-10-31 LAB — BASIC METABOLIC PANEL
Anion gap: 8 (ref 5–15)
BUN: 13 mg/dL (ref 8–23)
CO2: 21 mmol/L — ABNORMAL LOW (ref 22–32)
Calcium: 8.6 mg/dL — ABNORMAL LOW (ref 8.9–10.3)
Chloride: 99 mmol/L (ref 98–111)
Creatinine, Ser: 0.68 mg/dL (ref 0.61–1.24)
GFR, Estimated: >60 mL/min (ref >60)
Glucose, Bld: 122 mg/dL — ABNORMAL HIGH (ref 70–99)
Potassium: 3.8 mmol/L (ref 3.5–5.1)
Sodium: 128 mmol/L — ABNORMAL LOW (ref 135–145)

## 2023-10-31 LAB — CBC
HCT: 33.8 % — ABNORMAL LOW (ref 39.0–52.0)
Hemoglobin: 11.5 g/dL — ABNORMAL LOW (ref 13.0–17.0)
MCH: 30.3 pg (ref 26.0–34.0)
MCHC: 34 g/dL (ref 30.0–36.0)
MCV: 88.9 fL (ref 80.0–100.0)
Platelets: 205 10*3/uL (ref 150–400)
RBC: 3.8 MIL/uL — ABNORMAL LOW (ref 4.22–5.81)
RDW: 14.2 % (ref 11.5–15.5)
WBC: 9 10*3/uL (ref 4.0–10.5)
nRBC: 0 % (ref 0.0–0.2)

## 2023-10-31 LAB — CORTISOL: Cortisol, Plasma: 15.1 ug/dL

## 2023-10-31 LAB — GLUCOSE, CAPILLARY
Glucose-Capillary: 115 mg/dL — ABNORMAL HIGH (ref 70–99)
Glucose-Capillary: 118 mg/dL — ABNORMAL HIGH (ref 70–99)
Glucose-Capillary: 117 mg/dL — ABNORMAL HIGH (ref 70–99)
Glucose-Capillary: 132 mg/dL — ABNORMAL HIGH (ref 70–99)

## 2023-10-31 LAB — MAGNESIUM: Magnesium: 1.8 mg/dL (ref 1.7–2.4)

## 2023-10-31 LAB — TSH: TSH: 0.635 u[IU]/mL (ref 0.350–4.500)

## 2023-10-31 MED ORDER — LORAZEPAM 2 MG/ML IJ SOLN: 0.5000 mg | Freq: Four times a day (QID) | INTRAMUSCULAR | Status: DC | PRN

## 2023-10-31 MED ORDER — METHOCARBAMOL 1000 MG/10ML IJ SOLN: 500.0000 mg | Freq: Three times a day (TID) | INTRAMUSCULAR | Status: DC

## 2023-10-31 MED ORDER — DOCUSATE SODIUM 100 MG PO CAPS
100.0000 mg | ORAL_CAPSULE | Freq: Two times a day (BID) | ORAL | Status: DC
  Administered 2023-10-31 – 2023-11-04 (×9): 100 mg via ORAL
  Filled 2023-10-31 (×9): qty 1

## 2023-10-31 MED ORDER — OXYCODONE HCL 5 MG PO TABS
5.0000 mg | ORAL_TABLET | ORAL | Status: DC | PRN
Start: 2023-10-31 — End: 2023-11-04
  Administered 2023-10-31 – 2023-11-04 (×15): 10 mg via ORAL
  Filled 2023-10-31 (×15): qty 2

## 2023-10-31 MED ORDER — METHOCARBAMOL 1000 MG/10ML IJ SOLN
1000.0000 mg | Freq: Three times a day (TID) | INTRAMUSCULAR | Status: DC
  Administered 2023-10-31 – 2023-11-04 (×13): 1000 mg via INTRAVENOUS
  Filled 2023-10-31 (×17): qty 10

## 2023-10-31 MED ORDER — PANTOPRAZOLE SODIUM 20 MG PO TBEC
20.0000 mg | DELAYED_RELEASE_TABLET | Freq: Every day | ORAL | Status: DC
  Administered 2023-10-31 – 2023-11-04 (×5): 20 mg via ORAL
  Filled 2023-10-31 (×5): qty 1

## 2023-10-31 MED ORDER — CHLORHEXIDINE GLUCONATE CLOTH 2 % EX PADS
6.0000 | MEDICATED_PAD | Freq: Every day | CUTANEOUS | Status: DC
  Administered 2023-10-31 – 2023-11-04 (×5): 6 via TOPICAL

## 2023-10-31 MED ORDER — LORAZEPAM 0.5 MG PO TABS
0.5000 mg | ORAL_TABLET | Freq: Four times a day (QID) | ORAL | Status: DC | PRN
  Administered 2023-10-31 – 2023-11-03 (×4): 0.5 mg via ORAL
  Filled 2023-10-31 (×4): qty 1

## 2023-10-31 NOTE — Progress Notes (Signed)
[facility] PROGRESS NOTE   [name] QIO:[id number] DOB: 05-14-1956 DOA: 10/28/2023  PCP: Theodis Shove, DO  Brief History: 67 y.o.m w/ CAD s/p PCI with a bare-metal stent in 2013, HFrEF, type 2 diabetes, hyperlipidemia, obesity, who presented to the ED, referred to by GI due to progressive diffuse abdominal pain, distention, vomiting and unintentional weight loss x 2 weeks.  He underwent CT scan of his abdomen pelvis which raised concern for colon cancer with bowel obstruction.  He was hospitalized for further management.  Consultants: General surgery.  Gastroenterology.  Procedures: Exploratory laparotomy, omental biopsy, open gastrostomy tube placement, creation of loop ileostomy     Subjective/Interval History: Patient somewhat anxious and upset about his diagnosis.  Could not sleep well last night.  Abdominal pain is reasonably well-controlled.      Assessment/Plan:  Metastatic colon cancer with bowel obstruction  CT scan concerning for malignancy with metastatic process.  There was large bowel obstruction noted.  General surgery and gastroenterology were consulted.  Plan was for partial colectomy and resection of the tumor.  However when surgery was performed he was found to have tumor involving the entire mesentery.  Any attempted resection would cause catastrophic bleeding.  In view of this a biopsy was performed, open gastrostomy tube placement was done and creation of loop ileostomy.   Further postop management per general surgery.  Currently on clear liquids. Pathology sent during surgery showed signet ring features.  This suggest the aggressive nature of the cancer. Seen by medical oncology.  Not a candidate for any curative treatments.  Treatments would only be palliative in nature.  Palliative care has been consulted to assist with further management and disposition.  He may need to transition to hospice.  Hyponatremia Reason for this is not entirely  clear.  Urine osmolality was never sent.   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 TSH normal at 0.6.  Cortisol level is pending.  Urine osmolality is pending.  Normocytic anemia Looks like he was transfused in the OR.  Hemoglobin has improved.  Hypokalemia/hypomagnesemia Continue to monitor.  Replete as necessary.  Metabolic acidosis Stable  Diabetes mellitus type 2 Continue SSI.  HbA1c 6.3.  Chronic systolic CHF No recent echocardiogram in our system.  From 2013 his LVEF is noted to be 45 to 50%. Seems to be well compensated currently. Home medication list showed that he was on diuretics, beta-blocker, ACE inhibitor, spironolactone.  Continue to hold for now.  Obesity Estimated body mass index is 34.73 kg/m as calculated from the following:   Height as of this encounter: 5\' 10"  (1.778 m).   Weight as of this encounter: 109.8 kg.  Goals of care Palliative care has been consulted.  Oncology recommends hospice.  DVT Prophylaxis: Lovenox Code Status: Full code Family Communication: Discussed with patient Disposition Plan: To be determined  Status is: Inpatient Remains inpatient appropriate because: Bowel obstruction due to colonic mass      Medications: Scheduled:  Chlorhexidine Gluconate Cloth  6 each Topical Daily   enoxaparin (LOVENOX) injection  50 mg Subcutaneous Q24H   insulin aspart  0-9 Units Subcutaneous Q6H   methocarbamol (ROBAXIN) injection  1,000 mg Intravenous TID   pantoprazole  20 mg Oral Daily   sodium chloride flush  10 mL Intravenous Q12H   sodium chloride flush  10 mL Intravenous Q12H   Continuous:   LKG:[id number] (DILAUDID) injection, LORazepam **OR** LORazepam, melatonin, prochlorperazine  Antibiotics: Anti-infectives (From admission, onward)    Start  Dose/Rate Route Frequency Ordered Stop   10/30/23 0600  cefoTEtan (CEFOTAN) 2 g in sodium chloride 0.9 % 100 mL IVPB        2 g 200 mL/hr over 30 Minutes Intravenous On call  to O.R. 10/29/23 1924 10/30/23 1420   10/29/23 1400  neomycin (MYCIFRADIN) tablet 1,000 mg       Placed in "And" Linked Group   1,000 mg Oral 3 times per day 10/29/23 1020 10/30/23 0000   10/29/23 1400  metroNIDAZOLE (FLAGYL) tablet 1,000 mg       Placed in "And" Linked Group   1,000 mg Oral 3 times per day 10/29/23 1020 10/30/23 0000       Objective:  Vital Signs  Vitals:   10/30/23 1356 10/30/23 2012 10/31/23 0559 10/31/23 0613  BP: (!) 141/72 119/62 (!) 143/80   Pulse: 70 79 81   Resp: 18 16    Temp: (!) 97.5 F (36.4 C) (!) 97.3 F (36.3 C) 97.7 F (36.5 C)   TempSrc: Oral Oral Oral   SpO2: 100% 99% 97%   Weight:    109.8 kg  Height:        Intake/Output Summary (Last 24 hours) at 10/31/2023 1005 Last data filed at 10/31/2023 0610 Gross per 24 hour  Intake 1776 ml  Output 2245 ml  Net -469 ml   Filed Weights   10/28/23 1630 10/29/23 1204 10/31/23 0613  Weight: 104.3 kg 105.7 kg 109.8 kg    General appearance: Awake alert.  In no distress Resp: Clear to auscultation bilaterally.  Normal effort Cardio: S1-S2 is normal regular.  No S3-S4.  No rubs murmurs or bruit GI: Abdomen is soft.  Distended.  G-tube is noted.  Ileostomy is noted.   Lab Results:  Data Reviewed: I have personally reviewed following labs and reports of the imaging studies  CBC: Recent Labs  Lab 10/28/23 1640 10/29/23 0617 10/30/23 0556 10/30/23 0934 10/30/23 0945 10/30/23 1049 10/31/23 0648  WBC 5.1 3.3* 3.4*  --   --   --  9.0  HGB 8.7* 7.6* 8.0* 6.8* 7.1* 10.5* 11.5*  HCT 25.7* 22.1* 24.6* 20.0* 21.0* 31.0* 33.8*  MCV 91.1 90.6 92.5  --   --   --  88.9  PLT 224 187 196  --   --   --  205    Basic Metabolic Panel: Recent Labs  Lab 10/28/23 1640 10/29/23 0617 10/30/23 0556 10/30/23 0934 10/30/23 0945 10/30/23 1049 10/31/23 0648  NA 127* 127* 127* 130* 130* 130* 128*  K 3.8 3.5 3.4* 3.6 3.6 4.0 3.8  CL 94* 98 95*  --  99  --  99  CO2 22 19* 22  --   --   --  21*   GLUCOSE 135* 110* 220*  --  182*  --  122*  BUN 44* 38* 30*  --  22  --  13  CREATININE 1.00 0.87 0.93  --  0.80  --  0.68  CALCIUM 9.3 8.8* 8.8*  --   --   --  8.6*  MG  --  1.6* 1.9  --   --   --  1.8  PHOS  --  2.8  --   --   --   --   --     GFR: Estimated Creatinine Clearance: 109.6 mL/min (by C-G formula based on SCr of 0.68 mg/dL).  Liver Function Tests: Recent Labs  Lab 10/28/23 1640  AST 26  ALT 27  ALKPHOS 78  BILITOT 1.5*  PROT 7.8  ALBUMIN 3.5    Recent Labs  Lab 10/28/23 1640  LIPASE 31   HbA1C: Recent Labs    10/29/23 1439  HGBA1C 6.3*    CBG: Recent Labs  Lab 10/30/23 1243 10/30/23 1824 10/30/23 2358 10/31/23 0603 10/31/23 0709  GLUCAP 133* 154* 102* 115* 118*    Recent Results (from the past 240 hour(s))  Surgical PCR screen     Status: None   Collection Time: 10/29/23  2:01 PM   Specimen: Nasal Mucosa; Nasal Swab  Result Value Ref Range Status   MRSA, PCR NEGATIVE NEGATIVE Final   Staphylococcus aureus NEGATIVE NEGATIVE Final    Comment: (NOTE) The Xpert SA Assay (FDA approved for NASAL specimens in patients 4 years of age and older), is one component of a comprehensive surveillance program. It is not intended to diagnose infection nor to guide or monitor treatment. Performed at [facility], [address] [place], [place] [address]       Radiology Studies: No results found.     LOS: 2 days   Lorieann Argueta Rito Ehrlich  [facility] Pager on [email or web address]  10/31/2023, 10:05 AM

## 2023-10-31 NOTE — Progress Notes (Signed)
Progress Note  1 Day Post-Op  Subjective: Sitting up in bedside chair.  Having some expected postop abdominal pain with good response to pain medications.  Tolerating clear liquids with G-tube to gravity.  No nausea or emesis but does feel bloated. WOC RN Heather in room at time of my visit as well. Objective: Vital signs in last 24 hours: Temp:  [97.3 F (36.3 C)-97.9 F (36.6 C)] 97.7 F (36.5 C) (11/08 0559) Pulse Rate:  [70-81] 81 (11/08 0559) Resp:  [12-24] 16 (11/07 2012) BP: (119-144)/(62-80) 143/80 (11/08 0559) SpO2:  [95 %-100 %] 97 % (11/08 0559) Arterial Line BP: (141-148)/(54-60) 142/54 (11/07 1215) Weight:  [109.8 kg] 109.8 kg (11/08 0613) Last BM Date : 10/30/23  Intake/Output from previous day: 11/07 0701 - 11/08 0700 In: 2191 [P.O.:710; Blood:831; IV Piggyback:650] Out: 2445 [Urine:1350; Drains:100; Stool:975; Blood:20] Intake/Output this shift: No intake/output data recorded.  PE: General: pleasant, WD, male who is sitting in bedside chair NAD  HEENT: head is normocephalic, atraumatic.  Sclera are noninjected.  Pupils equal and round. EOMs intact.  Ears and nose without any masses or lesions.  Mouth is pink and moist Lungs:Respiratory effort nonlabored on room air Abd: soft, mild to moderate distention.  Mild expected TTP over incision and around ostomy.  Incision CDI with staples and some blood on honeycomb dressing.  Ostomy with stoma pink and viable and gas and small amount of soft mixed with liquid stool in bag. G-tube to gravity with gastric contents. MSK: all 4 extremities are symmetrical with no cyanosis, clubbing, or edema. Skin: warm and dry  Psych: A&Ox3 with an appropriate affect.    Lab Results:  Recent Labs    10/30/23 0556 10/30/23 0934 10/30/23 1049 10/31/23 0648  WBC 3.4*  --   --  9.0  HGB 8.0*   < > 10.5* 11.5*  HCT 24.6*   < > 31.0* 33.8*  PLT 196  --   --  205   < > = values in this interval not displayed.   BMET Recent Labs     10/30/23 0556 10/30/23 0934 10/30/23 0945 10/30/23 1049 10/31/23 0648  NA 127*   < > 130* 130* 128*  K 3.4*   < > 3.6 4.0 3.8  CL 95*  --  99  --  99  CO2 22  --   --   --  21*  GLUCOSE 220*  --  182*  --  122*  BUN 30*  --  22  --  13  CREATININE 0.93  --  0.80  --  0.68  CALCIUM 8.8*  --   --   --  8.6*   < > = values in this interval not displayed.   PT/INR No results for input(s): "LABPROT", "INR" in the last 72 hours. CMP     Component Value Date/Time   NA 128 (L) 10/31/2023 0648   K 3.8 10/31/2023 0648   CL 99 10/31/2023 0648   CO2 21 (L) 10/31/2023 0648   GLUCOSE 122 (H) 10/31/2023 0648   BUN 13 10/31/2023 0648   CREATININE 0.68 10/31/2023 0648   CALCIUM 8.6 (L) 10/31/2023 0648   PROT 7.8 10/28/2023 1640   ALBUMIN 3.5 10/28/2023 1640   AST 26 10/28/2023 1640   ALT 27 10/28/2023 1640   ALKPHOS 78 10/28/2023 1640   BILITOT 1.5 (H) 10/28/2023 1640   GFRNONAA >60 10/31/2023 0648   GFRAA >90 11/02/2013 1331   Lipase     Component Value  Date/Time   LIPASE 31 10/28/2023 1640       Studies/Results: No results found.  Anti-infectives: Anti-infectives (From admission, onward)    Start     Dose/Rate Route Frequency Ordered Stop   10/30/23 0600  cefoTEtan (CEFOTAN) 2 g in sodium chloride 0.9 % 100 mL IVPB        2 g 200 mL/hr over 30 Minutes Intravenous On call to O.R. 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 Corliss Skains 11/7  - intra-op frozen path with poorly differentiated signet cell adenocarcinoma. Follow final bx results. Onc consulted and recc hospice consult -Tolerating clears with some bowel function.  Clamp G-tube and continue CLD ADAT FLD. -Discontinue Foley catheter -WOC RN consulted for new ostomy.  Change honeycomb dressing at time of first ostomy change. -PT/OT, mobilize  FEN: CLD ADAT FLD ID: Cefotetan peri op VTE: Lovenox    LOS: 2 days   Eric Form, [facility] Surgery 10/31/2023, 8:15 AM Please see Amion for pager number during day hours 7:00am-4:30pm

## 2023-10-31 NOTE — Consult Note (Addendum)
WOC Nurse ostomy consult note; had loop ileostomy placed by Dr. Corliss Skains 10/30/2023  Stoma type/location: RLQ loop ileostomy  Stomal assessment/size: 1 1/2" well budded, pink moist edematous, slightly oval  Peristomal assessment: intact, has midline incision with staples to left of wound, does not interfere with pouching  Treatment options for stomal/peristomal skin: 2" barrier ring  Output approximately 100 mls liquid brown effluent  Ostomy pouching: 2 piece 2 1/4" skin barrier [place] [phone]), 2 1/4" pouch [place] [phone]) and 2" barrier ring [place] [phone])  Education provided: Educated patient and significant other on care of ileostomy.  We discussed emptying pouch when 1/3 to 1/2 full.  SO was able to demonstrate how to empty pouch using lock and roll mechanism, clean spout with toilet paper wick and close pouch.  Discussed changing entire pouching system 2 times a week and as needed for leaking.  We removed current pouch.  Discussed cleaning around stoma with water moistened washcloth only.  Sized current stoma at 1 1/2" slightly oval, noted to have red rubber stabilizing catheter in place.  Educated on how stoma with likely change in size over the next month, should be sized with each pouch change and that stabilizing catheter will be removed usually 7-14 days postop at surgeon's discretion.  SO was able to cut new skin barrier at 1 1/2", I widened out slightly on sides to accommodate for catheter.  I stretched a 2" barrier ring to fit snugly around stoma and placed the new skin barrier over barrier ring.  SO snapped new pouch onto skin barrier.    We went over all educational material including handout with step by step instructions on changing a 2 piece pouching system. We discussed high output ileostomy handout and ways to prevent dehydration.  Discussed bathing with pouch on or off.  Also discussed emptying pouch directly into toilet.    Enrolled patient in [employer] DC program:  Yes  Ordered (8) sets of 2 1/4" pouch, 2 1/4" skin barrier and 2" barrier rings for room. Provided patient with stoma powder and no sting barrier wipe for crusting.    WOC team will continue to follow for ostomy education and support.   Thanks,  [employer] MSN, RN-BC, [employer] [phone]

## 2023-10-31 NOTE — Progress Notes (Signed)
OOB to chair in the morning.  Foley discontinued at 1000.  Patient walked to bathroom and voided at 1405. CHG bath given, washcloth given and he cleaned his own peri area.  Sat on the edge of the bed for awhile. Back to bed with SCD's on.

## 2023-10-31 NOTE — Plan of Care (Signed)
  Problem: Education: Goal: Ability to describe self-care measures that may prevent or decrease complications (Diabetes Survival Skills Education) will improve Outcome: Progressing   Problem: Coping: Goal: Ability to adjust to condition or change in health will improve Outcome: Progressing   Problem: Nutritional: Goal: Maintenance of adequate nutrition will improve Outcome: Progressing   Problem: Skin Integrity: Goal: Risk for impaired skin integrity will decrease Outcome: Progressing   Problem: Education: Goal: Knowledge of General Education information will improve Description: Including pain rating scale, medication(s)/side effects and non-pharmacologic comfort measures Outcome: Progressing   Problem: Pain Management: Goal: General experience of comfort will improve Outcome: Progressing   Problem: Safety: Goal: Ability to remain free from injury will improve Outcome: Progressing

## 2023-11-01 DIAGNOSIS — E871 Hypo-osmolality and hyponatremia: Secondary | ICD-10-CM | POA: Diagnosis not present

## 2023-11-01 DIAGNOSIS — K56609 Unspecified intestinal obstruction, unspecified as to partial versus complete obstruction: Secondary | ICD-10-CM | POA: Diagnosis not present

## 2023-11-01 DIAGNOSIS — K56601 Complete intestinal obstruction, unspecified as to cause: Secondary | ICD-10-CM

## 2023-11-01 DIAGNOSIS — C189 Malignant neoplasm of colon, unspecified: Secondary | ICD-10-CM | POA: Diagnosis not present

## 2023-11-01 DIAGNOSIS — Z7189 Other specified counseling: Secondary | ICD-10-CM | POA: Diagnosis not present

## 2023-11-01 DIAGNOSIS — Z515 Encounter for palliative care: Secondary | ICD-10-CM

## 2023-11-01 DIAGNOSIS — C786 Secondary malignant neoplasm of retroperitoneum and peritoneum: Secondary | ICD-10-CM | POA: Diagnosis not present

## 2023-11-01 LAB — CBC
HCT: 34.2 % — ABNORMAL LOW (ref 39.0–52.0)
Hemoglobin: 11.6 g/dL — ABNORMAL LOW (ref 13.0–17.0)
MCH: 30 pg (ref 26.0–34.0)
MCHC: 33.9 g/dL (ref 30.0–36.0)
MCV: 88.4 fL (ref 80.0–100.0)
Platelets: 210 10*3/uL (ref 150–400)
RBC: 3.87 MIL/uL — ABNORMAL LOW (ref 4.22–5.81)
RDW: 13.7 % (ref 11.5–15.5)
WBC: 10.8 10*3/uL — ABNORMAL HIGH (ref 4.0–10.5)
nRBC: 0 % (ref 0.0–0.2)

## 2023-11-01 LAB — BASIC METABOLIC PANEL
Anion gap: 10 (ref 5–15)
BUN: 5 mg/dL — ABNORMAL LOW (ref 8–23)
CO2: 21 mmol/L — ABNORMAL LOW (ref 22–32)
Calcium: 8.8 mg/dL — ABNORMAL LOW (ref 8.9–10.3)
Chloride: 98 mmol/L (ref 98–111)
Creatinine, Ser: 0.49 mg/dL — ABNORMAL LOW (ref 0.61–1.24)
GFR, Estimated: >60 mL/min (ref >60)
Glucose, Bld: 131 mg/dL — ABNORMAL HIGH (ref 70–99)
Potassium: 4.2 mmol/L (ref 3.5–5.1)
Sodium: 129 mmol/L — ABNORMAL LOW (ref 135–145)

## 2023-11-01 LAB — GLUCOSE, CAPILLARY
Glucose-Capillary: 122 mg/dL — ABNORMAL HIGH (ref 70–99)
Glucose-Capillary: 123 mg/dL — ABNORMAL HIGH (ref 70–99)
Glucose-Capillary: 128 mg/dL — ABNORMAL HIGH (ref 70–99)
Glucose-Capillary: 141 mg/dL — ABNORMAL HIGH (ref 70–99)
Glucose-Capillary: 145 mg/dL — ABNORMAL HIGH (ref 70–99)
Glucose-Capillary: 147 mg/dL — ABNORMAL HIGH (ref 70–99)

## 2023-11-01 MED ORDER — CARVEDILOL 6.25 MG PO TABS
6.2500 mg | ORAL_TABLET | Freq: Two times a day (BID) | ORAL | Status: DC
  Administered 2023-11-01 – 2023-11-04 (×6): 6.25 mg via ORAL
  Filled 2023-11-01 (×6): qty 1

## 2023-11-01 MED ORDER — LISINOPRIL 5 MG PO TABS
2.5000 mg | ORAL_TABLET | Freq: Every day | ORAL | Status: DC
  Administered 2023-11-01 – 2023-11-04 (×4): 2.5 mg via ORAL
  Filled 2023-11-01 (×4): qty 1

## 2023-11-01 NOTE — Progress Notes (Signed)
2 Days Post-Op   Subjective/Chief Complaint: Complains of not feeling well but stable   Objective: Vital signs in last 24 hours: Temp:  [98 F (36.7 C)-98.5 F (36.9 C)] 98.4 F (36.9 C) (11/09 0439) Pulse Rate:  [89-104] 104 (11/09 0439) Resp:  [16-18] 16 (11/09 0439) BP: (142-152)/(83-96) 142/87 (11/09 0439) SpO2:  [97 %-100 %] 97 % (11/09 0439) Last BM Date : 10/30/23  Intake/Output from previous day: 11/08 0701 - 11/09 0700 In: 60  Out: 215 [Drains:40; Stool:175] Intake/Output this shift: No intake/output data recorded.  General appearance: alert and cooperative Resp: clear to auscultation bilaterally Cardio: regular rate and rhythm GI: soft, nontender. Ostomy productive  Lab Results:  Recent Labs    10/31/23 0648 11/01/23 0725  WBC 9.0 10.8*  HGB 11.5* 11.6*  HCT 33.8* 34.2*  PLT 205 210   BMET Recent Labs    10/31/23 0648 11/01/23 0725  NA 128* 129*  K 3.8 4.2  CL 99 98  CO2 21* 21*  GLUCOSE 122* 131*  BUN 13 5*  CREATININE 0.68 0.49*  CALCIUM 8.6* 8.8*   PT/INR No results for input(s): "LABPROT", "INR" in the last 72 hours. ABG Recent Labs    10/30/23 0934 10/30/23 1049  PHART 7.279* 7.276*  HCO3 20.9 20.1    Studies/Results: No results found.  Anti-infectives: Anti-infectives (From admission, onward)    Start     Dose/Rate Route Frequency Ordered Stop   10/30/23 0600  cefoTEtan (CEFOTAN) 2 g in sodium chloride 0.9 % 100 mL IVPB        2 g 200 mL/hr over 30 Minutes Intravenous On call to O.R. 10/29/23 1924 10/30/23 1420   10/29/23 1400  neomycin (MYCIFRADIN) tablet 1,000 mg       Placed in "And" Linked Group   1,000 mg Oral 3 times per day 10/29/23 1020 10/30/23 0000   10/29/23 1400  metroNIDAZOLE (FLAGYL) tablet 1,000 mg       Placed in "And" Linked Group   1,000 mg Oral 3 times per day 10/29/23 1020 10/30/23 0000       Assessment/Plan: s/p Procedure(s): Exploratory laparotomy; loop ileostomy creation; open g-tube  placement; omentum biopsy (N/A) Advance diet. 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 Corliss Skains 11/7   - intra-op frozen path with poorly differentiated signet cell adenocarcinoma. Follow final bx results. Onc consulted and recc hospice consult -Tolerating clears with some bowel function.  Clamp G-tube and continue CLD ADAT FLD. -Discontinue Foley catheter -WOC RN consulted for new ostomy.  Change honeycomb dressing at time of first ostomy change. -PT/OT, mobilize   FEN: CLD ADAT FLD ID: Cefotetan peri op VTE: Lovenox  LOS: 3 days    Chevis Pretty III 11/01/2023

## 2023-11-01 NOTE — Progress Notes (Signed)
OT Cancellation Note  Patient Details Name: [name] MRN: [id number] DOB: [birth year]   Cancelled Treatment:    Reason Eval/Treat Not Completed: OT screened, no needs identified, will sign off Patient is transitioning to hospice house. OT to sign off Rosalio Loud, MS Acute Rehabilitation Department Office# [phone]  11/01/2023, 2:19 PM

## 2023-11-01 NOTE — Consult Note (Signed)
Consultation Note Date: 11/01/2023   Patient Name: [name]  DOB: 28-Dec-1955  MRN: [id number]  Age / Sex: 67 y.o., male  PCP: Combs, Prince Solian, DO Referring Physician: Osvaldo Shipper, MD  Reason for Consultation: GOC. Consideration of hospice in this patient with metastatic colon cancer.  HPI/Patient Profile: 67 y.o. male  with past medical history of CAD, DM, HTN, HLD, osteoarthritis admitted on 10/28/2023 with abdominal pain, vomiting, unintentional weight loss. Workup revealed obstructing colon mass. Underwent surgery which found unresectable tumor encompassing entire mesentery and peritoneal carcinomatosis. Biopsy was performed, g-tube placed, and loop ileostomy created. Pathology revealed primary colon cancer with signet ring features which is a very aggressive type cancer. Oncology recommended hospice care.   Primary Decision Maker PATIENT - he reports that he has Advance Directives appointing his girlfriend [name] as his HCPOA  Discussion: Chart reviewed including labs, progress notes, imaging from this and previous encounters.  Met at bedside with patient. He was awake, alert, and oriented.  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 He has a clear understanding of his terminal illness.   His hopes are that the time he has left are not spent in pain. He is the primary caretaker for his mother and he hopes to ensure her care is well coordinated with his brothers.   Advanced care planning   With his permission we discussed Advanced Care planning.  He has a Living Will and HCPOA document- [name] is designated HCPOA in the  Patient would not want to be on artificial life support long-term.  We discussed CPR and express  preference for and agrees that DO NOT RESUSCITATE order best aligns with patient's goals of care should he experience cessation of cardiac and respiratory function.    Hospice services and philosophy of care were discussed. He does not want to die at home. He is agreeable to referral for hospice services at home with plan for eventual transfer to inpatient hospice facility.   We discussed his symptoms. He feels his symptoms are currently well controlled.     SUMMARY OF RECOMMENDATIONS -DNR- order entered and Gold Form completed on chart -TOC order entered for referral to hospice agency- recommended patient select one of the hospice agencies that have an inpatient facility as he does not desire to die in his home    Code Status/Advance Care Planning: DNR   Prognosis:   < 6 months  Discharge Planning: Home with Hospice  Primary Diagnoses: Present on Admission:  Bowel obstruction (HCC)   Review of Systems  Physical Exam  Vital Signs: BP (!) 142/87 (BP Location: Left Arm)   Pulse (!) 104   Temp 98.4 F (36.9 C) (Oral)   Resp 16   Ht 5\' 10"  (1.778 m)   Wt 109.8 kg   SpO2 97%   BMI 34.73 kg/m  Pain Scale: 0-10 POSS *See Group  Information*: 1-Acceptable,Awake and alert Pain Score: 8    SpO2: SpO2: 97 % O2 Device:SpO2: 97 % O2 Flow Rate: .O2 Flow Rate (L/min): 2 L/min  IO: Intake/output summary:  Intake/Output Summary (Last 24 hours) at 11/01/2023 1042 Last data filed at 11/01/2023 0900 Gross per 24 hour  Intake 180 ml  Output 515 ml  Net -335 ml    LBM: Last BM Date : 10/30/23 Baseline Weight: Weight: 104.3 kg Most recent weight: Weight: 109.8 kg       Thank you for this consult. Palliative medicine will continue to follow and assist as needed.  Time Total: 120 minutes Advanced care planning time: 30 minutes Signed by: Ocie Bob, AGNP-C Palliative Medicine  Time includes:   Preparing to see the patient (e.g., review of tests) Obtaining and/or reviewing  separately obtained history Performing a medically necessary appropriate examination and/or evaluation Counseling and educating the patient/family/caregiver Ordering medications, tests, or procedures Referring and communicating with other health care professionals (when not reported separately) Documenting clinical information in the electronic or other health record Independently interpreting results (not reported separately) and communicating results to the patient/family/caregiver Care coordination (not reported separately) Clinical documentation   Please contact Palliative Medicine Team phone at [phone] for questions and concerns.  For individual provider: See Loretha Stapler

## 2023-11-01 NOTE — Plan of Care (Signed)
  Problem: Education: Goal: Ability to describe self-care measures that may prevent or decrease complications (Diabetes Survival Skills Education) will improve Outcome: Progressing   Problem: Nutritional: Goal: Maintenance of adequate nutrition will improve Outcome: Progressing   Problem: Skin Integrity: Goal: Risk for impaired skin integrity will decrease Outcome: Progressing   Problem: Activity: Goal: Risk for activity intolerance will decrease Outcome: Progressing   Problem: Pain Management: Goal: General experience of comfort will improve Outcome: Progressing   Problem: Safety: Goal: Ability to remain free from injury will improve Outcome: Progressing

## 2023-11-01 NOTE — Progress Notes (Signed)
PT Cancellation Note  Patient Details Name: [name] MRN: [id number] DOB: January 04, 1956   Cancelled Treatment:    Reason Eval/Treat Not Completed: PT screened, no needs identified, will sign off; plan is for transition to inpt hospice.    [facility] 11/01/2023, 1:14 PM

## 2023-11-01 NOTE — Progress Notes (Signed)
[facility] PROGRESS NOTE   [name] EXB:[id number] DOB: 12/15/1956 DOA: 10/28/2023  PCP: Theodis Shove, DO  Brief History: 67 y.o.m w/ CAD s/p PCI with a bare-metal stent in 2013, HFrEF, type 2 diabetes, hyperlipidemia, obesity, who presented to the ED, referred to by GI due to progressive diffuse abdominal pain, distention, vomiting and unintentional weight loss x 2 weeks.  He underwent CT scan of his abdomen pelvis which raised concern for colon cancer with bowel obstruction.  He was hospitalized for further management.  Consultants: General surgery.  Gastroenterology.  Procedures: Exploratory laparotomy, omental biopsy, open gastrostomy tube placement, creation of loop ileostomy     Subjective/Interval History: Patient denies any abdominal pain.  Generalized aches and pains mention.  Denies any nausea vomiting.  Could not sleep well overnight.      Assessment/Plan:  Metastatic colon cancer with bowel obstruction  CT scan concerning for malignancy with metastatic process.  There was large bowel obstruction noted.  General surgery and gastroenterology were consulted.  Plan was for partial colectomy and resection of the tumor.  However when surgery was performed he was found to have tumor involving the entire mesentery.  Any attempted resection would cause catastrophic bleeding.  In view of this a biopsy was performed, open gastrostomy tube placement was done and creation of loop ileostomy.   Further postop management per general surgery.  Diet being slowly advanced. Pathology sent during surgery showed signet ring features.  This suggest the aggressive nature of the cancer. Seen by medical oncology.  Not a candidate for any curative treatments.  Treatments would only be palliative in nature.   Palliative care has been consulted to assist with further management and disposition.  He is agreeable to going home with hospice services.  Hyponatremia Reason for this is  not entirely clear.  Urine osmolality was never sent.   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 TSH normal at 0.6.  Cortisol level is 15.1.    Normocytic anemia Looks like he was transfused in the OR.  Hemoglobin has improved.  Hypokalemia/hypomagnesemia Continue to monitor.  Replete as necessary.  Metabolic acidosis Stable  Diabetes mellitus type 2 Continue SSI.  HbA1c 6.3.  Chronic systolic CHF No recent echocardiogram in our system.  From 2013 his LVEF is noted to be 45 to 50%. Seems to be well compensated currently. Home medication list showed that he was on diuretics, beta-blocker, ACE inhibitor, spironolactone.  Will resume his beta-blocker and his ACE inhibitor.  Obesity Estimated body mass index is 34.73 kg/m as calculated from the following:   Height as of this encounter: 5\' 10"  (1.778 m).   Weight as of this encounter: 109.8 kg.  Goals of care Palliative care has been consulted.  Oncology recommends hospice.  DVT Prophylaxis: Lovenox Code Status: Full code Family Communication: Discussed with patient Disposition Plan: To be determined  Status is: Inpatient Remains inpatient appropriate because: Bowel obstruction due to colonic mass      Medications: Scheduled:  Chlorhexidine Gluconate Cloth  6 each Topical Daily   docusate sodium  100 mg Oral BID   enoxaparin (LOVENOX) injection  50 mg Subcutaneous Q24H   insulin aspart  0-9 Units Subcutaneous Q6H   methocarbamol (ROBAXIN) injection  1,000 mg Intravenous TID   pantoprazole  20 mg Oral Daily   sodium chloride flush  10 mL Intravenous Q12H   sodium chloride flush  10 mL Intravenous Q12H   Continuous:   NUU:[id number] (DILAUDID) injection, LORazepam **OR** LORazepam, melatonin, oxyCODONE,  prochlorperazine  Antibiotics: Anti-infectives (From admission, onward)    Start     Dose/Rate Route Frequency Ordered Stop   10/30/23 0600  cefoTEtan (CEFOTAN) 2 g in sodium chloride 0.9 % 100 mL IVPB         2 g 200 mL/hr over 30 Minutes Intravenous On call to O.R. 10/29/23 1924 10/30/23 1420   10/29/23 1400  neomycin (MYCIFRADIN) tablet 1,000 mg       Placed in "And" Linked Group   1,000 mg Oral 3 times per day 10/29/23 1020 10/30/23 0000   10/29/23 1400  metroNIDAZOLE (FLAGYL) tablet 1,000 mg       Placed in "And" Linked Group   1,000 mg Oral 3 times per day 10/29/23 1020 10/30/23 0000       Objective:  Vital Signs  Vitals:   10/31/23 0613 10/31/23 1450 10/31/23 2134 11/01/23 0439  BP:  (!) 145/83 (!) 152/96 (!) 142/87  Pulse:  89 (!) 101 (!) 104  Resp:  18 16 16   Temp:  98 F (36.7 C) 98.5 F (36.9 C) 98.4 F (36.9 C)  TempSrc:  Oral Oral Oral  SpO2:  98% 100% 97%  Weight: 109.8 kg     Height:        Intake/Output Summary (Last 24 hours) at 11/01/2023 0927 Last data filed at 11/01/2023 0859 Gross per 24 hour  Intake 60 ml  Output 515 ml  Net -455 ml   Filed Weights   10/28/23 1630 10/29/23 1204 10/31/23 0613  Weight: 104.3 kg 105.7 kg 109.8 kg    General appearance: Awake alert.  In no distress Resp: Clear to auscultation bilaterally.  Normal effort Cardio: S1-S2 is normal regular.  No S3-S4.  No rubs murmurs or bruit GI: Abdomen is soft.  Ileostomy is noted.  G-tube is noted. Physical deconditioning is present. No obvious focal neurological deficits.   Lab Results:  Data Reviewed: I have personally reviewed following labs and reports of the imaging studies  CBC: Recent Labs  Lab 10/28/23 1640 10/29/23 0617 10/30/23 0556 10/30/23 0934 10/30/23 0945 10/30/23 1049 10/31/23 0648 11/01/23 0725  WBC 5.1 3.3* 3.4*  --   --   --  9.0 10.8*  HGB 8.7* 7.6* 8.0* 6.8* 7.1* 10.5* 11.5* 11.6*  HCT 25.7* 22.1* 24.6* 20.0* 21.0* 31.0* 33.8* 34.2*  MCV 91.1 90.6 92.5  --   --   --  88.9 88.4  PLT 224 187 196  --   --   --  205 210    Basic Metabolic Panel: Recent Labs  Lab 10/28/23 1640 10/29/23 0617 10/30/23 0556 10/30/23 0934 10/30/23 0945  10/30/23 1049 10/31/23 0648 11/01/23 0725  NA 127* 127* 127* 130* 130* 130* 128* 129*  K 3.8 3.5 3.4* 3.6 3.6 4.0 3.8 4.2  CL 94* 98 95*  --  99  --  99 98  CO2 22 19* 22  --   --   --  21* 21*  GLUCOSE 135* 110* 220*  --  182*  --  122* 131*  BUN 44* 38* 30*  --  22  --  13 5*  CREATININE 1.00 0.87 0.93  --  0.80  --  0.68 0.49*  CALCIUM 9.3 8.8* 8.8*  --   --   --  8.6* 8.8*  MG  --  1.6* 1.9  --   --   --  1.8  --   PHOS  --  2.8  --   --   --   --   --   --  GFR: Estimated Creatinine Clearance: 109.6 mL/min (A) (by C-G formula based on SCr of 0.49 mg/dL (L)).  Liver Function Tests: Recent Labs  Lab 10/28/23 1640  AST 26  ALT 27  ALKPHOS 78  BILITOT 1.5*  PROT 7.8  ALBUMIN 3.5    Recent Labs  Lab 10/28/23 1640  LIPASE 31   HbA1C: Recent Labs    10/29/23 1439  HGBA1C 6.3*    CBG: Recent Labs  Lab 10/31/23 1121 10/31/23 1813 11/01/23 0007 11/01/23 0531 11/01/23 0746  GLUCAP 117* 132* 145* 128* 122*    Recent Results (from the past 240 hour(s))  Surgical PCR screen     Status: None   Collection Time: 10/29/23  2:01 PM   Specimen: Nasal Mucosa; Nasal Swab  Result Value Ref Range Status   MRSA, PCR NEGATIVE NEGATIVE Final   Staphylococcus aureus NEGATIVE NEGATIVE Final    Comment: (NOTE) The Xpert SA Assay (FDA approved for NASAL specimens in patients 20 years of age and older), is one component of a comprehensive surveillance program. It is not intended to diagnose infection nor to guide or monitor treatment. Performed at [facility], [address] [place], [place] [address]       Radiology Studies: No results found.     LOS: 3 days   Zaedyn Covin [employer] on [email or web address]  11/01/2023, 9:27 AM

## 2023-11-02 DIAGNOSIS — K56609 Unspecified intestinal obstruction, unspecified as to partial versus complete obstruction: Secondary | ICD-10-CM | POA: Diagnosis not present

## 2023-11-02 DIAGNOSIS — E871 Hypo-osmolality and hyponatremia: Secondary | ICD-10-CM | POA: Diagnosis not present

## 2023-11-02 DIAGNOSIS — C189 Malignant neoplasm of colon, unspecified: Secondary | ICD-10-CM | POA: Diagnosis not present

## 2023-11-02 LAB — BPAM RBC
Blood Product Expiration Date: 202411282359
Blood Product Expiration Date: 202411282359
Blood Product Expiration Date: 202411282359
Blood Product Expiration Date: 202411292359
ISSUE DATE / TIME: 202411070928
ISSUE DATE / TIME: 202411071018
ISSUE DATE / TIME: 202411070928
ISSUE DATE / TIME: 202411071018
Unit Type and Rh: 5100
Unit Type and Rh: 5100
Unit Type and Rh: 5100
Unit Type and Rh: 5100

## 2023-11-02 LAB — BASIC METABOLIC PANEL
Anion gap: 8 (ref 5–15)
BUN: 7 mg/dL — ABNORMAL LOW (ref 8–23)
CO2: 22 mmol/L (ref 22–32)
Calcium: 8.9 mg/dL (ref 8.9–10.3)
Chloride: 98 mmol/L (ref 98–111)
Creatinine, Ser: 0.57 mg/dL — ABNORMAL LOW (ref 0.61–1.24)
GFR, Estimated: >60 mL/min (ref >60)
Glucose, Bld: 124 mg/dL — ABNORMAL HIGH (ref 70–99)
Potassium: 3.9 mmol/L (ref 3.5–5.1)
Sodium: 128 mmol/L — ABNORMAL LOW (ref 135–145)

## 2023-11-02 LAB — TYPE AND SCREEN
ABO/RH(D): O POS
Antibody Screen: NEGATIVE
Unit division: 0
Unit division: 0
Unit division: 0
Unit division: 0

## 2023-11-02 LAB — CBC
HCT: 35.5 % — ABNORMAL LOW (ref 39.0–52.0)
Hemoglobin: 11.9 g/dL — ABNORMAL LOW (ref 13.0–17.0)
MCH: 30.1 pg (ref 26.0–34.0)
MCHC: 33.5 g/dL (ref 30.0–36.0)
MCV: 89.9 fL (ref 80.0–100.0)
Platelets: 208 10*3/uL (ref 150–400)
RBC: 3.95 MIL/uL — ABNORMAL LOW (ref 4.22–5.81)
RDW: 13.7 % (ref 11.5–15.5)
WBC: 12.9 10*3/uL — ABNORMAL HIGH (ref 4.0–10.5)
nRBC: 0 % (ref 0.0–0.2)

## 2023-11-02 LAB — GLUCOSE, CAPILLARY
Glucose-Capillary: 132 mg/dL — ABNORMAL HIGH (ref 70–99)
Glucose-Capillary: 149 mg/dL — ABNORMAL HIGH (ref 70–99)
Glucose-Capillary: 166 mg/dL — ABNORMAL HIGH (ref 70–99)
Glucose-Capillary: 180 mg/dL — ABNORMAL HIGH (ref 70–99)

## 2023-11-02 MED ORDER — INSULIN ASPART 100 UNIT/ML IJ SOLN
0.0000 [IU] | Freq: Three times a day (TID) | INTRAMUSCULAR | Status: DC
  Administered 2023-11-02: 1 [IU] via SUBCUTANEOUS
  Administered 2023-11-02: 2 [IU] via SUBCUTANEOUS
  Administered 2023-11-03 – 2023-11-04 (×2): 1 [IU] via SUBCUTANEOUS

## 2023-11-02 NOTE — Progress Notes (Signed)
[facility] PROGRESS NOTE   [name] UEA:[id number] DOB: 1956-09-28 DOA: 10/28/2023  PCP: Theodis Shove, DO  Brief History: 67 y.o.m w/ CAD s/p PCI with a bare-metal stent in 2013, HFrEF, type 2 diabetes, hyperlipidemia, obesity, who presented to the ED, referred to by GI due to progressive diffuse abdominal pain, distention, vomiting and unintentional weight loss x 2 weeks.  He underwent CT scan of his abdomen pelvis which raised concern for colon cancer with bowel obstruction.  He was hospitalized for further management.  Consultants: General surgery.  Gastroenterology.  Procedures: Exploratory laparotomy, omental biopsy, open gastrostomy tube placement, creation of loop ileostomy     Subjective/Interval History: Pain is better controlled now.  Denies any chest pain shortness of breath.  Looking forward to going home soon.     Assessment/Plan:  Metastatic colon cancer with bowel obstruction  CT scan concerning for malignancy with metastatic process.  There was large bowel obstruction noted.  General surgery and gastroenterology were consulted.  Plan was for partial colectomy and resection of the tumor.  However when surgery was performed he was found to have tumor involving the entire mesentery.  Any attempted resection would cause catastrophic bleeding.  In view of this a biopsy was performed, open gastrostomy tube placement was done and creation of loop ileostomy.   Further postop management per general surgery.  Diet being slowly advanced. Pathology sent during surgery showed signet ring features.  This suggest the aggressive nature of the cancer. Seen by medical oncology.  Not a candidate for any curative treatments.  Treatments would only be palliative in nature.   Palliative care was consulted.  They met with the patient yesterday.  Patient is now DNR.  Plan is for home with hospice services and then eventually to residential hospice as his disease  progresses. Will need to be cleared by general surgery before discharge.  Hyponatremia Reason for this is not entirely clear.  Urine osmolality was never sent.   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 TSH normal at 0.6.  Cortisol level is 15.1.    Normocytic anemia Looks like he was transfused in the OR.  Hemoglobin has improved.  Hypokalemia/hypomagnesemia Continue to monitor.  Replete as necessary.  Metabolic acidosis Stable  Diabetes mellitus type 2 Continue SSI.  HbA1c 6.3.  Chronic systolic CHF No recent echocardiogram in our system.  From 2013 his LVEF is noted to be 45 to 50%. Seems to be well compensated currently. Home medication list showed that he was on diuretics, beta-blocker, ACE inhibitor, spironolactone.   His carvedilol and ACE inhibitor's were resumed.  Holding diuretics.  Obesity Estimated body mass index is 34.73 kg/m as calculated from the following:   Height as of this encounter: 5\' 10"  (1.778 m).   Weight as of this encounter: 109.8 kg.  Goals of care Home with hospice.  Seen by palliative care.  DVT Prophylaxis: Lovenox Code Status: Full code Family Communication: Discussed with patient Disposition Plan: Home with hospice when cleared by general surgery.  Status is: Inpatient Remains inpatient appropriate because: Bowel obstruction due to colonic mass      Medications: Scheduled:  carvedilol  6.25 mg Oral BID WC   Chlorhexidine Gluconate Cloth  6 each Topical Daily   docusate sodium  100 mg Oral BID   enoxaparin (LOVENOX) injection  50 mg Subcutaneous Q24H   insulin aspart  0-9 Units Subcutaneous TID WC   lisinopril  2.5 mg Oral Daily   methocarbamol (ROBAXIN) injection  1,000  mg Intravenous TID   pantoprazole  20 mg Oral Daily   sodium chloride flush  10 mL Intravenous Q12H   sodium chloride flush  10 mL Intravenous Q12H   Continuous:   XBJ:[id number] (DILAUDID) injection, LORazepam **OR** LORazepam, melatonin, oxyCODONE,  prochlorperazine  Antibiotics: Anti-infectives (From admission, onward)    Start     Dose/Rate Route Frequency Ordered Stop   10/30/23 0600  cefoTEtan (CEFOTAN) 2 g in sodium chloride 0.9 % 100 mL IVPB        2 g 200 mL/hr over 30 Minutes Intravenous On call to O.R. 10/29/23 1924 10/30/23 1420   10/29/23 1400  neomycin (MYCIFRADIN) tablet 1,000 mg       Placed in "And" Linked Group   1,000 mg Oral 3 times per day 10/29/23 1020 10/30/23 0000   10/29/23 1400  metroNIDAZOLE (FLAGYL) tablet 1,000 mg       Placed in "And" Linked Group   1,000 mg Oral 3 times per day 10/29/23 1020 10/30/23 0000       Objective:  Vital Signs  Vitals:   11/01/23 1708 11/01/23 2030 11/02/23 0458 11/02/23 0945  BP: 128/89 119/66 131/77 125/67  Pulse:  95 97   Resp:  16 16   Temp:  98.9 F (37.2 C) 98.3 F (36.8 C)   TempSrc:  Oral Oral   SpO2:  96% 99%   Weight:   109.8 kg   Height:        Intake/Output Summary (Last 24 hours) at 11/02/2023 0952 Last data filed at 11/02/2023 0500 Gross per 24 hour  Intake --  Output 1000 ml  Net -1000 ml   Filed Weights   10/29/23 1204 10/31/23 0613 11/02/23 0458  Weight: 105.7 kg 109.8 kg 109.8 kg    General appearance: Awake alert.  In no distress Resp: Clear to auscultation bilaterally.  Normal effort Cardio: S1-S2 is normal regular.  No S3-S4.  No rubs murmurs or bruit GI: Abdomen is soft..  Bowel sounds are heard.  Ileostomy and G-tube is noted. Extremities: No edema.  Full range of motion of lower extremities. Neurologic: No focal neurological deficits.   Lab Results:  Data Reviewed: I have personally reviewed following labs and reports of the imaging studies  CBC: Recent Labs  Lab 10/29/23 0617 10/30/23 0556 10/30/23 0934 10/30/23 0945 10/30/23 1049 10/31/23 0648 11/01/23 0725 11/02/23 0713  WBC 3.3* 3.4*  --   --   --  9.0 10.8* 12.9*  HGB 7.6* 8.0*   < > 7.1* 10.5* 11.5* 11.6* 11.9*  HCT 22.1* 24.6*   < > 21.0* 31.0* 33.8*  34.2* 35.5*  MCV 90.6 92.5  --   --   --  88.9 88.4 89.9  PLT 187 196  --   --   --  205 210 208   < > = values in this interval not displayed.    Basic Metabolic Panel: Recent Labs  Lab 10/29/23 0617 10/30/23 0556 10/30/23 0934 10/30/23 0945 10/30/23 1049 10/31/23 0648 11/01/23 0725 11/02/23 0713  NA 127* 127*   < > 130* 130* 128* 129* 128*  K 3.5 3.4*   < > 3.6 4.0 3.8 4.2 3.9  CL 98 95*  --  99  --  99 98 98  CO2 19* 22  --   --   --  21* 21* 22  GLUCOSE 110* 220*  --  182*  --  122* 131* 124*  BUN 38* 30*  --  22  --  13 5* 7*  CREATININE 0.87 0.93  --  0.80  --  0.68 0.49* 0.57*  CALCIUM 8.8* 8.8*  --   --   --  8.6* 8.8* 8.9  MG 1.6* 1.9  --   --   --  1.8  --   --   PHOS 2.8  --   --   --   --   --   --   --    < > = values in this interval not displayed.    GFR: Estimated Creatinine Clearance: 109.6 mL/min (A) (by C-G formula based on SCr of 0.57 mg/dL (L)).  Liver Function Tests: Recent Labs  Lab 10/28/23 1640  AST 26  ALT 27  ALKPHOS 78  BILITOT 1.5*  PROT 7.8  ALBUMIN 3.5    CBG: Recent Labs  Lab 11/01/23 0746 11/01/23 1133 11/01/23 1656 11/01/23 2345 11/02/23 0456  GLUCAP 122* 123* 141* 147* 132*    Recent Results (from the past 240 hour(s))  Surgical PCR screen     Status: None   Collection Time: 10/29/23  2:01 PM   Specimen: Nasal Mucosa; Nasal Swab  Result Value Ref Range Status   MRSA, PCR NEGATIVE NEGATIVE Final   Staphylococcus aureus NEGATIVE NEGATIVE Final    Comment: (NOTE) The Xpert SA Assay (FDA approved for NASAL specimens in patients 101 years of age and older), is one component of a comprehensive surveillance program. It is not intended to diagnose infection nor to guide or monitor treatment. Performed at [facility], [address], [place], [place] [address]       Radiology Studies: No results found.     LOS: 4 days   Jackelynn Hosie [employer] on  [email or web address]  11/02/2023, 9:52 AM

## 2023-11-02 NOTE — Progress Notes (Signed)
3 Days Post-Op   Subjective/Chief Complaint: No complaints   Objective: Vital signs in last 24 hours: Temp:  [97.6 F (36.4 C)-98.9 F (37.2 C)] 98.3 F (36.8 C) (11/10 0458) Pulse Rate:  [95-100] 97 (11/10 0458) Resp:  [16] 16 (11/10 0458) BP: (119-137)/(66-89) 131/77 (11/10 0458) SpO2:  [96 %-100 %] 99 % (11/10 0458) Weight:  [109.8 kg] 109.8 kg (11/10 0458) Last BM Date : 10/30/23  Intake/Output from previous day: 11/09 0701 - 11/10 0700 In: 120 [P.O.:120] Out: 1300 [Urine:900; Stool:400] Intake/Output this shift: No intake/output data recorded.  General appearance: alert and cooperative Resp: clear to auscultation bilaterally Cardio: regular rate and rhythm GI: soft, nontender. Slightly more distended today but good bs  Lab Results:  Recent Labs    10/31/23 0648 11/01/23 0725  WBC 9.0 10.8*  HGB 11.5* 11.6*  HCT 33.8* 34.2*  PLT 205 210   BMET Recent Labs    10/31/23 0648 11/01/23 0725  NA 128* 129*  K 3.8 4.2  CL 99 98  CO2 21* 21*  GLUCOSE 122* 131*  BUN 13 5*  CREATININE 0.68 0.49*  CALCIUM 8.6* 8.8*   PT/INR No results for input(s): "LABPROT", "INR" in the last 72 hours. ABG Recent Labs    10/30/23 0934 10/30/23 1049  PHART 7.279* 7.276*  HCO3 20.9 20.1    Studies/Results: No results found.  Anti-infectives: Anti-infectives (From admission, onward)    Start     Dose/Rate Route Frequency Ordered Stop   10/30/23 0600  cefoTEtan (CEFOTAN) 2 g in sodium chloride 0.9 % 100 mL IVPB        2 g 200 mL/hr over 30 Minutes Intravenous On call to O.R. 10/29/23 1924 10/30/23 1420   10/29/23 1400  neomycin (MYCIFRADIN) tablet 1,000 mg       Placed in "And" Linked Group   1,000 mg Oral 3 times per day 10/29/23 1020 10/30/23 0000   10/29/23 1400  metroNIDAZOLE (FLAGYL) tablet 1,000 mg       Placed in "And" Linked Group   1,000 mg Oral 3 times per day 10/29/23 1020 10/30/23 0000       Assessment/Plan: s/p Procedure(s): Exploratory  laparotomy; loop ileostomy creation; open g-tube placement; omentum biopsy (N/A) Advance diet. 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 Corliss Skains 11/7   - intra-op frozen path with poorly differentiated signet cell adenocarcinoma. Follow final bx results. Onc consulted and recc hospice consult -Tolerating soft with some bowel function.  -Discontinue Foley catheter -WOC RN consulted for new ostomy.  Change honeycomb dressing at time of first ostomy change. -PT/OT, mobilize   FEN: soft diet ID: Cefotetan peri op VTE: Lovenox  LOS: 4 days    [name] 11/02/2023

## 2023-11-02 NOTE — Plan of Care (Signed)
  Problem: Education: Goal: Ability to describe self-care measures that may prevent or decrease complications (Diabetes Survival Skills Education) will improve Outcome: Progressing   Problem: Nutritional: Goal: Maintenance of adequate nutrition will improve Outcome: Progressing   Problem: Skin Integrity: Goal: Risk for impaired skin integrity will decrease Outcome: Progressing   Problem: Elimination: Goal: Will not experience complications related to bowel motility Outcome: Progressing   Problem: Pain Management: Goal: General experience of comfort will improve Outcome: Progressing

## 2023-11-03 DIAGNOSIS — K56609 Unspecified intestinal obstruction, unspecified as to partial versus complete obstruction: Secondary | ICD-10-CM | POA: Diagnosis not present

## 2023-11-03 DIAGNOSIS — C189 Malignant neoplasm of colon, unspecified: Secondary | ICD-10-CM | POA: Diagnosis not present

## 2023-11-03 DIAGNOSIS — E871 Hypo-osmolality and hyponatremia: Secondary | ICD-10-CM | POA: Diagnosis not present

## 2023-11-03 DIAGNOSIS — Z515 Encounter for palliative care: Secondary | ICD-10-CM

## 2023-11-03 LAB — GLUCOSE, CAPILLARY
Glucose-Capillary: 127 mg/dL — ABNORMAL HIGH (ref 70–99)
Glucose-Capillary: 146 mg/dL — ABNORMAL HIGH (ref 70–99)
Glucose-Capillary: 109 mg/dL — ABNORMAL HIGH (ref 70–99)
Glucose-Capillary: 166 mg/dL — ABNORMAL HIGH (ref 70–99)

## 2023-11-03 MED ORDER — CARVEDILOL 6.25 MG PO TABS: 6.2500 mg | ORAL_TABLET | Freq: Two times a day (BID) | ORAL | 1 refills | Status: DC

## 2023-11-03 MED ORDER — SENNA 8.6 MG PO TABS: 2.0000 | ORAL_TABLET | Freq: Every day | ORAL | 0 refills | Status: DC

## 2023-11-03 MED ORDER — MELATONIN 5 MG PO TABS: 5.0000 mg | ORAL_TABLET | Freq: Every evening | ORAL | 0 refills | Status: DC | PRN

## 2023-11-03 MED ORDER — PANTOPRAZOLE SODIUM 20 MG PO TBEC: 20.0000 mg | DELAYED_RELEASE_TABLET | Freq: Every day | ORAL | 0 refills | Status: DC

## 2023-11-03 MED ORDER — OXYCODONE HCL 5 MG PO TABS: 5.0000 mg | ORAL_TABLET | ORAL | 0 refills | Status: DC | PRN

## 2023-11-03 MED ORDER — METHOCARBAMOL 500 MG PO TABS: 500.0000 mg | ORAL_TABLET | Freq: Three times a day (TID) | ORAL | 0 refills | Status: DC | PRN

## 2023-11-03 MED ORDER — LORAZEPAM 0.5 MG PO TABS: 0.5000 mg | ORAL_TABLET | Freq: Three times a day (TID) | ORAL | 0 refills | Status: DC | PRN

## 2023-11-03 NOTE — TOC Initial Note (Signed)
Transition of Care [facility]) - Initial/Assessment Note    Patient Details  Name: [name] MRN: [id number] Date of Birth: 07-07-1956  Transition of [facility]) CM/SW Contact:    Darleene Cleaver, LCSW Phone Number: 11/03/2023, 10:50 AM  Clinical Narrative:                  CSW received consult that patient would like to go  home with hospice services.  CSW spoke to patient to discuss hospice services, and which agencies are able to provide home hospice services.  CSW provided choice, patient said he did not have a preference for an agency.  CSW contacted [facility] and spoke to Henderson Newcomer to provide referral.  CSW asked patient if he needed any equipment at home, patient stated that he has a walker and wheelchair.  He does not feel he needs a hospital bed at this time.  CSW explained to him that [facility] will talk to him and his significant other to discuss what services are available.  Patient gave CSW permission to make referral to [facility].  TOC to continue to follow patient's progress throughout discharge planning.    Expected Discharge Plan: Home w Hospice Care Barriers to Discharge: Continued Medical Work up   Patient Goals and CMS Choice Patient states their goals for this hospitalization and ongoing recovery are:: To return back home with hospice services. CMS Medicare.gov Compare Post Acute Care list provided to:: Patient Choice offered to / list presented to : Patient [place] ownership interest in [facility].provided to:: Patient    Expected Discharge Plan and Services In-house Referral: Hospice / Palliative Care   Post Acute Care Choice: Hospice Living arrangements for the past 2 months: Single Family Home                                      Prior Living Arrangements/Services Living arrangements for the past 2 months: Single Family Home Lives with:: Significant Other Patient language and need for interpreter reviewed:: Yes         Need for Family Participation in Patient Care: No (Comment) Care giver support system in place?: No (comment)   Criminal Activity/Legal Involvement Pertinent to Current Situation/Hospitalization: No - Comment as needed  Activities of Daily Living   ADL Screening (condition at time of admission) Independently performs ADLs?: Yes (appropriate for developmental age) Is the patient deaf or have difficulty hearing?: No Does the patient have difficulty seeing, even when wearing glasses/contacts?: No Does the patient have difficulty concentrating, remembering, or making decisions?: No  Permission Sought/Granted Permission sought to share information with : Case Manager, Family Supports, Other (comment) Permission granted to share information with : Yes, Release of Information Signed, Yes, Verbal Permission Granted  Share Information with NAME: Malone,Charlene Significant other [phone]  [phone]  Permission granted to share info w AGENCY: Hospice agencies        Emotional Assessment Appearance:: Appears stated age Attitude/Demeanor/Rapport: Engaged, Grieving Affect (typically observed): Accepting, Appropriate, Calm, Stable Orientation: : Oriented to Self, Oriented to Place, Oriented to  Time, Oriented to Situation Alcohol / Substance Use: Not Applicable Psych Involvement: No (comment)  Admission diagnosis:  Bowel obstruction (HCC) [K56.609] Large bowel obstruction (HCC) [K56.609] Colonic mass [K63.89] Patient Active Problem List   Diagnosis Date Noted   Peritoneal carcinomatosis (HCC) 10/30/2023   Colon cancer metastasized to liver (HCC) 10/30/2023   Bowel obstruction (HCC) 10/29/2023  Diabetes mellitus without complication (HCC) 03/30/2021   Preoperative cardiovascular examination 06/23/2013   Pulmonary nodule 02/24/2012   Swelling, mass, or lump in chest 02/24/2012   Emphysema 02/24/2012   Allergic rhinitis, seasonal 02/24/2012   Chronic systolic heart failure (HCC)  16/09/9603   Coronary atherosclerosis of native coronary artery 12/28/2011   PSVT (paroxysmal supraventricular tachycardia) (HCC) 12/28/2011   Acute systolic congestive heart failure (HCC) 12/26/2011   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 [place] [address] Phone: [phone] Fax: [phone]     Social Determinants of Health (SDOH) Social History: SDOH Screenings   Food Insecurity: No Food Insecurity (10/29/2023)  Housing: Low Risk  (10/29/2023)  Transportation Needs: No Transportation Needs (10/29/2023)  Utilities: Not At Risk (10/29/2023)  Social Connections: Unknown (05/05/2022)   Received from [facility]  Tobacco Use: Medium Risk (10/30/2023)   SDOH Interventions:     Readmission Risk Interventions     No data to display

## 2023-11-03 NOTE — Progress Notes (Signed)
[facility] PROGRESS NOTE   [name] ZOX:[id number] DOB: Nov 09, 1956 DOA: 10/28/2023  PCP: Theodis Shove, DO  Brief History: 67 y.o.m w/ CAD s/p PCI with a bare-metal stent in 2013, HFrEF, type 2 diabetes, hyperlipidemia, obesity, who presented to the ED, referred to by GI due to progressive diffuse abdominal pain, distention, vomiting and unintentional weight loss x 2 weeks.  He underwent CT scan of his abdomen pelvis which raised concern for colon cancer with bowel obstruction.  He was hospitalized for further management.  Consultants: General surgery.  Gastroenterology.  Procedures: Exploratory laparotomy, omental biopsy, open gastrostomy tube placement, creation of loop ileostomy     Subjective/Interval History: Patient feels well for the most part.  He mentions that his girlfriend is nervous about taking care of him at home.  He has been able to go to the bathroom and back without difficulty.  No complaints of pain this morning.    Assessment/Plan:  Metastatic colon cancer with bowel obstruction  CT scan concerning for malignancy with metastatic process.  There was large bowel obstruction noted.  General surgery and gastroenterology were consulted.  Plan was for partial colectomy and resection of the tumor.  However when surgery was performed he was found to have tumor involving the entire mesentery.  Any attempted resection would cause catastrophic bleeding.  In view of this a biopsy was performed, open gastrostomy tube placement was done and creation of loop ileostomy.   Pathology sent during surgery showed signet ring features.  This suggest the aggressive nature of the cancer. Seen by medical oncology.  Not a candidate for any curative treatments.  Treatments would only be palliative in nature.  Patient was seen by palliative care.  Patient is now DNR.  Referral made to hospice agency.  Diet has been advanced.  Wait on clearance from general surgery before  discharge.   Would ideally like for hospice representative to also meet with patient and discussed with him and his girlfriend.  Will have PT and OT evaluate patient to assess functional abilities.  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 Urine osmolality was never sent.   TSH normal at 0.6.  Cortisol level is 15.1.    Normocytic anemia Looks like he was transfused in the OR.  Hemoglobin has improved.  Hypokalemia/hypomagnesemia Continue to monitor.  Replete as necessary.  Metabolic acidosis Stable  Diabetes mellitus type 2 Continue SSI.  HbA1c 6.3.  Chronic systolic CHF No recent echocardiogram in our system.  From 2013 his LVEF is noted to be 45 to 50%. Seems to be well compensated currently. Home medication list showed that he was on diuretics, beta-blocker, ACE inhibitor, spironolactone.   His carvedilol and ACE inhibitor's were resumed.  Holding diuretics. Blood pressure and heart rate are stable.  Obesity Estimated body mass index is 34.32 kg/m as calculated from the following:   Height as of this encounter: 5\' 10"  (1.778 m).   Weight as of this encounter: 108.5 kg.  Goals of care Home with hospice.  Seen by palliative care.  DVT Prophylaxis: Lovenox Code Status: Full code Family Communication: Discussed with patient Disposition Plan: Home with hospice when cleared by general surgery.  Anticipate discharge tomorrow.  PT and OT evaluation today.  Status is: Inpatient Remains inpatient appropriate because: Bowel obstruction due to colonic mass      Medications: Scheduled:  carvedilol  6.25 mg Oral BID WC   Chlorhexidine Gluconate Cloth  6 each Topical Daily   docusate sodium  100  mg Oral BID   enoxaparin (LOVENOX) injection  50 mg Subcutaneous Q24H   insulin aspart  0-9 Units Subcutaneous TID WC   lisinopril  2.5 mg Oral Daily   methocarbamol (ROBAXIN) injection  1,000 mg Intravenous TID   pantoprazole  20 mg Oral Daily   sodium chloride  flush  10 mL Intravenous Q12H   sodium chloride flush  10 mL Intravenous Q12H   Continuous:   ZOX:[id number] (DILAUDID) injection, LORazepam **OR** LORazepam, melatonin, oxyCODONE, prochlorperazine  Antibiotics: Anti-infectives (From admission, onward)    Start     Dose/Rate Route Frequency Ordered Stop   10/30/23 0600  cefoTEtan (CEFOTAN) 2 g in sodium chloride 0.9 % 100 mL IVPB        2 g 200 mL/hr over 30 Minutes Intravenous On call to O.R. 10/29/23 1924 10/30/23 1420   10/29/23 1400  neomycin (MYCIFRADIN) tablet 1,000 mg       Placed in "And" Linked Group   1,000 mg Oral 3 times per day 10/29/23 1020 10/30/23 0000   10/29/23 1400  metroNIDAZOLE (FLAGYL) tablet 1,000 mg       Placed in "And" Linked Group   1,000 mg Oral 3 times per day 10/29/23 1020 10/30/23 0000       Objective:  Vital Signs  Vitals:   11/02/23 0945 11/02/23 1440 11/02/23 2117 11/03/23 0534  BP: 125/67 125/71 116/74 136/72  Pulse:  92 98 92  Resp:  16 16 16   Temp:  98.1 F (36.7 C) 98.6 F (37 C) 98.4 F (36.9 C)  TempSrc:  Oral Oral Oral  SpO2:  99% 97% 96%  Weight:    108.5 kg  Height:        Intake/Output Summary (Last 24 hours) at 11/03/2023 0906 Last data filed at 11/03/2023 0543 Gross per 24 hour  Intake 720 ml  Output 950 ml  Net -230 ml   Filed Weights   10/31/23 0613 11/02/23 0458 11/03/23 0534  Weight: 109.8 kg 109.8 kg 108.5 kg    General appearance: Awake alert.  In no distress Resp: Clear to auscultation bilaterally.  Normal effort Cardio: S1-S2 is normal regular.  No S3-S4.  No rubs murmurs or bruit GI: Abdomen is soft.  G-tube is noted.  Ostomy is noted. Physical deconditioning is noted but able to move his extremities.   Lab Results:  Data Reviewed: I have personally reviewed following labs and reports of the imaging studies  CBC: Recent Labs  Lab 10/29/23 0617 10/30/23 0556 10/30/23 0934 10/30/23 0945 10/30/23 1049 10/31/23 0648 11/01/23 0725  11/02/23 0713  WBC 3.3* 3.4*  --   --   --  9.0 10.8* 12.9*  HGB 7.6* 8.0*   < > 7.1* 10.5* 11.5* 11.6* 11.9*  HCT 22.1* 24.6*   < > 21.0* 31.0* 33.8* 34.2* 35.5*  MCV 90.6 92.5  --   --   --  88.9 88.4 89.9  PLT 187 196  --   --   --  205 210 208   < > = values in this interval not displayed.    Basic Metabolic Panel: Recent Labs  Lab 10/29/23 0617 10/30/23 0556 10/30/23 0934 10/30/23 0945 10/30/23 1049 10/31/23 0648 11/01/23 0725 11/02/23 0713  NA 127* 127*   < > 130* 130* 128* 129* 128*  K 3.5 3.4*   < > 3.6 4.0 3.8 4.2 3.9  CL 98 95*  --  99  --  99 98 98  CO2 19* 22  --   --   --  21* 21* 22  GLUCOSE 110* 220*  --  182*  --  122* 131* 124*  BUN 38* 30*  --  22  --  13 5* 7*  CREATININE 0.87 0.93  --  0.80  --  0.68 0.49* 0.57*  CALCIUM 8.8* 8.8*  --   --   --  8.6* 8.8* 8.9  MG 1.6* 1.9  --   --   --  1.8  --   --   PHOS 2.8  --   --   --   --   --   --   --    < > = values in this interval not displayed.    GFR: Estimated Creatinine Clearance: 109 mL/min (A) (by C-G formula based on SCr of 0.57 mg/dL (L)).  Liver Function Tests: Recent Labs  Lab 10/28/23 1640  AST 26  ALT 27  ALKPHOS 78  BILITOT 1.5*  PROT 7.8  ALBUMIN 3.5    CBG: Recent Labs  Lab 11/02/23 0456 11/02/23 1120 11/02/23 1530 11/02/23 2119 11/03/23 0741  GLUCAP 132* 149* 166* 180* 127*    Recent Results (from the past 240 hour(s))  Surgical PCR screen     Status: None   Collection Time: 10/29/23  2:01 PM   Specimen: Nasal Mucosa; Nasal Swab  Result Value Ref Range Status   MRSA, PCR NEGATIVE NEGATIVE Final   Staphylococcus aureus NEGATIVE NEGATIVE Final    Comment: (NOTE) The Xpert SA Assay (FDA approved for NASAL specimens in patients 75 years of age and older), is one component of a comprehensive surveillance program. It is not intended to diagnose infection nor to guide or monitor treatment. Performed at [facility], [address], [place], [place]  [address]       Radiology Studies: No results found.     LOS: 5 days   [facility] [employer] on [email or web address]  11/03/2023, 9:06 AM

## 2023-11-03 NOTE — Discharge Instructions (Signed)
[facility]      [facility], [place] [phone]  OPEN ABDOMINAL SURGERY: POST OP INSTRUCTIONS  Always review your discharge instruction sheet given to you by the facility where your surgery was performed.  IF YOU HAVE DISABILITY OR FAMILY LEAVE FORMS, YOU MUST BRING THEM TO THE OFFICE FOR PROCESSING.  PLEASE DO NOT GIVE THEM TO YOUR DOCTOR.  A prescription for pain medication may be given to you upon discharge.  Take your pain medication as prescribed, if needed.  If narcotic pain medicine is not needed, then you may take acetaminophen (Tylenol) or ibuprofen (Advil) as needed. Take your usually prescribed medications unless otherwise directed. If you need a refill on your pain medication, please contact your pharmacy. They will contact our office to request authorization.  Prescriptions will not be filled after 5pm or on week-ends. You should follow a light diet the first few days after arrival home, such as soup and crackers, pudding, etc.unless your doctor has advised otherwise. A high-fiber, low fat diet can be resumed as tolerated.   Be sure to include lots of fluids daily. Most patients will experience some swelling and bruising on the chest and neck area.  Ice packs will help.  Swelling and bruising can take several days to resolve Most patients will experience some swelling and bruising in the area of the incision. Ice pack will help. Swelling and bruising can take several days to resolve..  It is common to experience some constipation if taking pain medication after surgery.  Increasing fluid intake and taking a stool softener will usually help or prevent this problem from occurring.  A mild laxative (Milk of Magnesia or Miralax) should be taken according to package directions if there are no bowel movements after 48 hours.  You may have steri-strips (small skin tapes) in place directly over the incision.  These strips should be left on the skin for 7-10 days.  If your surgeon used skin  glue on the incision, you may shower in 24 hours.  The glue will flake off over the next 2-3 weeks.  Any sutures or staples will be removed at the office during your follow-up visit. You may find that a light gauze bandage over your incision may keep your staples from being rubbed or pulled. You may shower and replace the bandage daily. ACTIVITIES:  You may resume regular (light) daily activities beginning the next day--such as daily self-care, walking, climbing stairs--gradually increasing activities as tolerated.  You may have sexual intercourse when it is comfortable.  Refrain from any heavy lifting or straining until approved by your doctor. You may drive when you no longer are taking prescription pain medication, you can comfortably wear a seatbelt, and you can safely maneuver your car and apply brakes   WHEN TO CALL YOUR DOCTOR: Fever over 101.0 Inability to urinate Nausea and/or vomiting Extreme swelling or bruising Continued bleeding from incision. Increased pain, redness, or drainage from the incision. Difficulty swallowing or breathing Muscle cramping or spasms. Numbness or tingling in hands or feet or around lips.  The clinic staff is available to answer your questions during regular business hours.  Please don't hesitate to call and ask to speak to one of the nurses if you have concerns.  For further questions, please visit [email or web address]

## 2023-11-03 NOTE — Progress Notes (Signed)
Progress Note  4 Days Post-Op  Subjective: Pain well controlled on current medications. Wanting to go home with hospice. Denies nausea or vomiting.   Objective: Vital signs in last 24 hours: Temp:  [98.1 F (36.7 C)-98.6 F (37 C)] 98.4 F (36.9 C) (11/11 0534) Pulse Rate:  [92-98] 92 (11/11 0534) Resp:  [16] 16 (11/11 0534) BP: (116-136)/(71-74) 136/72 (11/11 0534) SpO2:  [96 %-99 %] 96 % (11/11 0534) Weight:  [108.5 kg] 108.5 kg (11/11 0534) Last BM Date : 11/02/23  Intake/Output from previous day: 11/10 0701 - 11/11 0700 In: 720 [P.O.:720] Out: 950 [Urine:750; Stool:200] Intake/Output this shift: No intake/output data recorded.  PE: General: pleasant, WD, obese male who is laying in bed in NAD Lungs: Respiratory effort nonlabored Abd: soft, NT, mild distention, honeycomb to midline, ostomy productive of gas and stool  Psych: A&Ox3 with an appropriate affect.    Lab Results:  Recent Labs    11/01/23 0725 11/02/23 0713  WBC 10.8* 12.9*  HGB 11.6* 11.9*  HCT 34.2* 35.5*  PLT 210 208   BMET Recent Labs    11/01/23 0725 11/02/23 0713  NA 129* 128*  K 4.2 3.9  CL 98 98  CO2 21* 22  GLUCOSE 131* 124*  BUN 5* 7*  CREATININE 0.49* 0.57*  CALCIUM 8.8* 8.9   PT/INR No results for input(s): "LABPROT", "INR" in the last 72 hours. CMP     Component Value Date/Time   NA 128 (L) 11/02/2023 0713   K 3.9 11/02/2023 0713   CL 98 11/02/2023 0713   CO2 22 11/02/2023 0713   GLUCOSE 124 (H) 11/02/2023 0713   BUN 7 (L) 11/02/2023 0713   CREATININE 0.57 (L) 11/02/2023 0713   CALCIUM 8.9 11/02/2023 0713   PROT 7.8 10/28/2023 1640   ALBUMIN 3.5 10/28/2023 1640   AST 26 10/28/2023 1640   ALT 27 10/28/2023 1640   ALKPHOS 78 10/28/2023 1640   BILITOT 1.5 (H) 10/28/2023 1640   GFRNONAA >60 11/02/2023 0713   GFRAA >90 11/02/2013 1331   Lipase     Component Value Date/Time   LIPASE 31 10/28/2023 1640       Studies/Results: No results  found.  Anti-infectives: Anti-infectives (From admission, onward)    Start     Dose/Rate Route Frequency Ordered Stop   10/30/23 0600  cefoTEtan (CEFOTAN) 2 g in sodium chloride 0.9 % 100 mL IVPB        2 g 200 mL/hr over 30 Minutes Intravenous On call to O.R. 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 Corliss Skains 11/7   - intra-op frozen path with poorly differentiated signet cell adenocarcinoma. 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  -WOC RN consulted for new ostomy.  Change honeycomb dressing at time of first ostomy change. -PT/OT, mobilize -  ONC consulted and recommended hospice, palliative saw and agreed. Fine for discharge home with hospice from surgical standpoint - will arrange RN visit for staple removal but if hospice RN able to remove staples - ok to cancel this appointment  - follow up with Dr. Corliss Skains as needed moving forward, general surgery will sign off   FEN: soft diet ID: Cefotetan peri op VTE: Lovenox   LOS: 5 days     Juliet Rude, [facility] Surgery 11/03/2023, 10:47 AM Please see Amion for pager number during day hours 7:00am-4:30pm

## 2023-11-03 NOTE — Plan of Care (Signed)
  Problem: Education: Goal: Ability to describe self-care measures that may prevent or decrease complications (Diabetes Survival Skills Education) will improve Outcome: Progressing Goal: Individualized Educational Video(s) Outcome: Progressing   Problem: Coping: Goal: Ability to adjust to condition or change in health will improve Outcome: Progressing   Problem: Fluid Volume: Goal: Ability to maintain a balanced intake and output will improve Outcome: Progressing   Problem: Health Behavior/Discharge Planning: Goal: Ability to identify and utilize available resources and services will improve Outcome: Progressing Goal: Ability to manage health-related needs will improve Outcome: Progressing   Problem: Metabolic: Goal: Ability to maintain appropriate glucose levels will improve Outcome: Progressing   Problem: Nutritional: Goal: Maintenance of adequate nutrition will improve Outcome: Progressing Goal: Progress toward achieving an optimal weight will improve Outcome: Progressing   Problem: Skin Integrity: Goal: Risk for impaired skin integrity will decrease Outcome: Progressing   Problem: Tissue Perfusion: Goal: Adequacy of tissue perfusion will improve Outcome: Progressing   Problem: Education: Goal: Knowledge of General Education information will improve Description: Including pain rating scale, medication(s)/side effects and non-pharmacologic comfort measures Outcome: Progressing   Problem: Health Behavior/Discharge Planning: Goal: Ability to manage health-related needs will improve Outcome: Progressing   Problem: Clinical Measurements: Goal: Ability to maintain clinical measurements within normal limits will improve Outcome: Progressing Goal: Will remain free from infection Outcome: Progressing Goal: Diagnostic test results will improve Outcome: Progressing Goal: Respiratory complications will improve Outcome: Progressing Goal: Cardiovascular complication will  be avoided Outcome: Progressing   Problem: Activity: Goal: Risk for activity intolerance will decrease Outcome: Progressing   Problem: Nutrition: Goal: Adequate nutrition will be maintained Outcome: Progressing   Problem: Coping: Goal: Level of anxiety will decrease Outcome: Progressing   Problem: Elimination: Goal: Will not experience complications related to bowel motility Outcome: Progressing Goal: Will not experience complications related to urinary retention Outcome: Progressing   Problem: Pain Management: Goal: General experience of comfort will improve Outcome: Progressing   Problem: Safety: Goal: Ability to remain free from injury will improve Outcome: Progressing   Problem: Skin Integrity: Goal: Risk for impaired skin integrity will decrease Outcome: Progressing   Problem: Education: Goal: Understanding of discharge needs will improve Outcome: Progressing Goal: Verbalization of understanding of the causes of altered bowel function will improve Outcome: Progressing   Problem: Activity: Goal: Ability to tolerate increased activity will improve Outcome: Progressing   Problem: Bowel/Gastric: Goal: Gastrointestinal status for postoperative course will improve Outcome: Progressing   Problem: Health Behavior/Discharge Planning: Goal: Identification of community resources to assist with postoperative recovery needs will improve Outcome: Progressing   Problem: Nutritional: Goal: Will attain and maintain optimal nutritional status will improve Outcome: Progressing   Problem: Clinical Measurements: Goal: Postoperative complications will be avoided or minimized Outcome: Progressing   Problem: Respiratory: Goal: Respiratory status will improve Outcome: Progressing   Problem: Skin Integrity: Goal: Will show signs of wound healing Outcome: Progressing

## 2023-11-04 DIAGNOSIS — C787 Secondary malignant neoplasm of liver and intrahepatic bile duct: Secondary | ICD-10-CM | POA: Diagnosis not present

## 2023-11-04 DIAGNOSIS — C189 Malignant neoplasm of colon, unspecified: Secondary | ICD-10-CM | POA: Diagnosis not present

## 2023-11-04 LAB — GLUCOSE, CAPILLARY
Glucose-Capillary: 136 mg/dL — ABNORMAL HIGH (ref 70–99)
Glucose-Capillary: 177 mg/dL — ABNORMAL HIGH (ref 70–99)

## 2023-11-04 LAB — SURGICAL PATHOLOGY

## 2023-11-04 NOTE — Plan of Care (Signed)
  Problem: Education: Goal: Ability to describe self-care measures that may prevent or decrease complications (Diabetes Survival Skills Education) will improve Outcome: Progressing Goal: Individualized Educational Video(s) Outcome: Progressing   Problem: Coping: Goal: Ability to adjust to condition or change in health will improve Outcome: Progressing   Problem: Fluid Volume: Goal: Ability to maintain a balanced intake and output will improve Outcome: Progressing   Problem: Health Behavior/Discharge Planning: Goal: Ability to identify and utilize available resources and services will improve Outcome: Progressing Goal: Ability to manage health-related needs will improve Outcome: Progressing   Problem: Metabolic: Goal: Ability to maintain appropriate glucose levels will improve Outcome: Progressing   Problem: Nutritional: Goal: Maintenance of adequate nutrition will improve Outcome: Progressing Goal: Progress toward achieving an optimal weight will improve Outcome: Progressing   Problem: Skin Integrity: Goal: Risk for impaired skin integrity will decrease Outcome: Progressing   Problem: Tissue Perfusion: Goal: Adequacy of tissue perfusion will improve Outcome: Progressing   Problem: Education: Goal: Knowledge of General Education information will improve Description: Including pain rating scale, medication(s)/side effects and non-pharmacologic comfort measures Outcome: Progressing   Problem: Health Behavior/Discharge Planning: Goal: Ability to manage health-related needs will improve Outcome: Progressing   Problem: Clinical Measurements: Goal: Ability to maintain clinical measurements within normal limits will improve Outcome: Progressing Goal: Will remain free from infection Outcome: Progressing Goal: Diagnostic test results will improve Outcome: Progressing Goal: Respiratory complications will improve Outcome: Progressing Goal: Cardiovascular complication will  be avoided Outcome: Progressing   Problem: Activity: Goal: Risk for activity intolerance will decrease Outcome: Progressing   Problem: Nutrition: Goal: Adequate nutrition will be maintained Outcome: Progressing   Problem: Coping: Goal: Level of anxiety will decrease Outcome: Progressing   Problem: Elimination: Goal: Will not experience complications related to bowel motility Outcome: Progressing Goal: Will not experience complications related to urinary retention Outcome: Progressing   Problem: Pain Management: Goal: General experience of comfort will improve Outcome: Progressing   Problem: Safety: Goal: Ability to remain free from injury will improve Outcome: Progressing   Problem: Skin Integrity: Goal: Risk for impaired skin integrity will decrease Outcome: Progressing   Problem: Education: Goal: Understanding of discharge needs will improve Outcome: Progressing Goal: Verbalization of understanding of the causes of altered bowel function will improve Outcome: Progressing   Problem: Activity: Goal: Ability to tolerate increased activity will improve Outcome: Progressing   Problem: Bowel/Gastric: Goal: Gastrointestinal status for postoperative course will improve Outcome: Progressing   Problem: Health Behavior/Discharge Planning: Goal: Identification of community resources to assist with postoperative recovery needs will improve Outcome: Progressing   Problem: Nutritional: Goal: Will attain and maintain optimal nutritional status will improve Outcome: Progressing   Problem: Clinical Measurements: Goal: Postoperative complications will be avoided or minimized Outcome: Progressing   Problem: Respiratory: Goal: Respiratory status will improve Outcome: Progressing   Problem: Skin Integrity: Goal: Will show signs of wound healing Outcome: Progressing

## 2023-11-04 NOTE — Progress Notes (Signed)
[facility], [facility] Liaison Note  Received request for hospice services at home after discharge. Spoke with patient and caregiver to initiate education related to hospice philosophy, services, and team approach care.  Patient/family verbalized understanding of information given.  DME needs discussed. Patient has a wheelchair and walker in the home already and requested that our AV nurse assess for other DME needs.   Please send signed and completed DNR home with patient. Please provide prescriptions at discharge as needed to ensure ongoing symptom management.   Thank you for the opportunity to participate in this patient's care.   Henderson Newcomer [facility] Liaison [phone]

## 2023-11-04 NOTE — TOC Transition Note (Signed)
Transition of Care [facility]) - CM/SW Discharge Note   Patient Details  Name: [name] MRN: [id number] Date of Birth: 1956-07-30  Transition of [facility]) CM/SW Contact:  Beckie Busing, RN Phone Number:[phone]  11/04/2023, 12:55 PM   Clinical Narrative:    Patient with discharge orders. Hospice referral previously initiated per notes. Any DME needs to be assessed by [facility]. No TOC needs noted at this time.    Final next level of care: Home w Hospice Care Barriers to Discharge: No Barriers Identified   Patient Goals and CMS Choice CMS Medicare.gov Compare Post Acute Care list provided to:: Patient Choice offered to / list presented to : Patient  Discharge Placement                         Discharge Plan and Services Additional resources added to the After Visit Summary for   In-house Referral: Hospice / Palliative Care   Post Acute Care Choice: Hospice          DME Arranged: N/A DME Agency: NA       HH Arranged: NA HH Agency: NA        Social Determinants of Health (SDOH) Interventions SDOH Screenings   Food Insecurity: No Food Insecurity (10/29/2023)  Housing: Low Risk  (10/29/2023)  Transportation Needs: No Transportation Needs (10/29/2023)  Utilities: Not At Risk (10/29/2023)  Social Connections: Unknown (05/05/2022)   Received from [facility]  Tobacco Use: Medium Risk (10/30/2023)     Readmission Risk Interventions     No data to display

## 2023-11-04 NOTE — Consult Note (Signed)
WOC Nurse ostomy follow up Stoma type/location:  RLQ, loop ileostomy  Stomal assessment/size: 1 3/4" oval, flush, pink Peristomal assessment: intact, red rubber support in place Treatment options for stomal/peristomal skin: 2" barrier ring  Output brown stool  Ostomy pouching: 2pc 2 3/4" with 2" barrier ring  Education provided:  Reviewed pouch change with patient, he has difficulty seeing stoma. CG has observed pouch change as well. Hospice to support continued ostomy needs at home Enrolled patient in [facility] Discharge program: Yes  (9) 2 1/4" pouching sets in the room with (9) barrier rings. New pattern from today, measuring guide, ostomy powder and skin barrier wipes ready for DC to home. Placed educational materials as well in the box with all of the ostomy supplies.   FU with surgeon to have support rod removed post operatively if hospice nursing can not remove.   Shelah Heatley [facility], CNS, [employer] [phone]

## 2023-11-04 NOTE — Progress Notes (Signed)
OT Cancellation Note  Patient Details Name: [name] MRN: [id number] DOB: 22-Jun-1956   Cancelled Treatment:    Reason Eval/Treat Not Completed: Other (comment) Patient to transition home with family support and hospice services. Appears to have all needed equipment per case manager note. No skilled OT needs identified.  Rosalio Loud, MS Acute Rehabilitation Department Office# [phone]  11/04/2023, 1:32 PM

## 2023-11-04 NOTE — Progress Notes (Signed)
PT Cancellation/Screen Note  Patient Details Name: [name] MRN: [id number] DOB: [birth year]   Cancelled Treatment:    Reason Eval/Treat Not Completed: Patient to transition home with family support and hospice services. Appears to have all needed equipment per case manager note. No skilled PT needs identified. Will sign off.     Faye Ramsay, PT Acute Rehabilitation  Office: [phone]

## 2023-11-04 NOTE — Discharge Summary (Signed)
[facility]  Physician Discharge Summary   Patient ID: [name] MRN: [id number] DOB/AGE: Feb 06, 1956 67 y.o.  Admit date: 10/28/2023 Discharge date:   11/04/2023   PCP: Theodis Shove, DO  DISCHARGE DIAGNOSES:  Metastatic colon cancer with bowel obstruction Status post ileostomy Status post G-tube placement Hyponatremia Normocytic anemia Hypokalemia and hypomagnesemia Chronic systolic CHF   RECOMMENDATIONS FOR OUTPATIENT FOLLOW UP: Patient to go home with hospice services   Home Health: None Equipment/Devices: None  CODE STATUS: DNR  DISCHARGE CONDITION: fair  Diet recommendation: As tolerated  INITIAL HISTORY: 67 y.o.m w/ CAD s/p PCI with a bare-metal stent in 2013, HFrEF, type 2 diabetes, hyperlipidemia, obesity, who presented to the ED, referred to by GI due to progressive diffuse abdominal pain, distention, vomiting and unintentional weight loss x 2 weeks.  He underwent CT scan of his abdomen pelvis which raised concern for colon cancer with bowel obstruction.  He was hospitalized for further management.   Consultants: General surgery.  Gastroenterology.  Palliative care   Procedures: Exploratory laparotomy, omental biopsy, open gastrostomy tube placement, creation of loop ileostomy   HOSPITAL COURSE:   Metastatic colon cancer with bowel obstruction  CT scan concerning for malignancy with metastatic process.  There was large bowel obstruction noted.  General surgery and gastroenterology were consulted.  Plan was for partial colectomy and resection of the tumor.  However when surgery was performed he was found to have tumor involving the entire mesentery.  Any attempted resection would cause catastrophic bleeding.  In view of this a biopsy was performed, open gastrostomy tube placement was done and creation of loop ileostomy.   Pathology sent during surgery showed signet ring features.  This suggest the aggressive nature of the cancer. Seen by  medical oncology.  Not a candidate for any curative treatments.  Treatments would only be palliative in nature.  Patient was seen by palliative care.  Patient is now DNR.  Referral made to hospice agency.  Diet has been advanced.  Cleared by general surgery for discharge.  Hospice has been informed and on board.     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 Urine osmolality was never sent.   TSH normal at 0.6.  Cortisol level is 15.1.     Normocytic anemia Looks like he was transfused in the OR.  Hemoglobin has improved.   Hypokalemia/hypomagnesemia    Metabolic acidosis Stable   Diabetes mellitus type 2 Continue SSI.  HbA1c 6.3.   Chronic systolic CHF No recent echocardiogram in our system.  From 2013 his LVEF is noted to be 45 to 50%. Seems to be well compensated currently. Home medication list showed that he was on diuretics, beta-blocker, ACE inhibitor, spironolactone.   His carvedilol and ACE inhibitor's were resumed.   Blood pressure and heart rate are stable.   Obesity Estimated body mass index is 34.32 kg/m as calculated from the following:   Height as of this encounter: 5\' 10"  (1.778 m).   Weight as of this encounter: 108.5 kg.   Goals of care Home with hospice.  Seen by palliative care.   Patient is stable.  Okay for discharge home today.   PERTINENT LABS:  The results of significant diagnostics from this hospitalization (including imaging, microbiology, ancillary and laboratory) are listed below for reference.    Microbiology: Recent Results (from the past 240 hour(s))  Surgical PCR screen     Status: None   Collection Time: 10/29/23  2:01 PM   Specimen: Nasal Mucosa; Nasal  Swab  Result Value Ref Range Status   MRSA, PCR NEGATIVE NEGATIVE Final   Staphylococcus aureus NEGATIVE NEGATIVE Final    Comment: (NOTE) The Xpert SA Assay (FDA approved for NASAL specimens in patients 81 years of age and older), is one component of a  comprehensive surveillance program. It is not intended to diagnose infection nor to guide or monitor treatment. Performed at [facility], [address] [place], [place] [address]      Labs:   Basic Metabolic Panel: Recent Labs  Lab 10/29/23 0617 10/30/23 0556 10/30/23 0934 10/30/23 0945 10/30/23 1049 10/31/23 0648 11/01/23 0725 11/02/23 0713  NA 127* 127*   < > 130* 130* 128* 129* 128*  K 3.5 3.4*   < > 3.6 4.0 3.8 4.2 3.9  CL 98 95*  --  99  --  99 98 98  CO2 19* 22  --   --   --  21* 21* 22  GLUCOSE 110* 220*  --  182*  --  122* 131* 124*  BUN 38* 30*  --  22  --  13 5* 7*  CREATININE 0.87 0.93  --  0.80  --  0.68 0.49* 0.57*  CALCIUM 8.8* 8.8*  --   --   --  8.6* 8.8* 8.9  MG 1.6* 1.9  --   --   --  1.8  --   --   PHOS 2.8  --   --   --   --   --   --   --    < > = values in this interval not displayed.   Liver Function Tests: Recent Labs  Lab 10/28/23 1640  AST 26  ALT 27  ALKPHOS 78  BILITOT 1.5*  PROT 7.8  ALBUMIN 3.5   Recent Labs  Lab 10/28/23 1640  LIPASE 31    CBC: Recent Labs  Lab 10/29/23 0617 10/30/23 0556 10/30/23 0934 10/30/23 0945 10/30/23 1049 10/31/23 0648 11/01/23 0725 11/02/23 0713  WBC 3.3* 3.4*  --   --   --  9.0 10.8* 12.9*  HGB 7.6* 8.0*   < > 7.1* 10.5* 11.5* 11.6* 11.9*  HCT 22.1* 24.6*   < > 21.0* 31.0* 33.8* 34.2* 35.5*  MCV 90.6 92.5  --   --   --  88.9 88.4 89.9  PLT 187 196  --   --   --  205 210 208   < > = values in this interval not displayed.     CBG: Recent Labs  Lab 11/03/23 0741 11/03/23 1121 11/03/23 1634 11/03/23 2112 11/04/23 0732  GLUCAP 127* 146* 109* 166* 136*     IMAGING STUDIES CT CHEST ABDOMEN PELVIS W CONTRAST  Result Date: 10/28/2023 CLINICAL DATA:  Unintentional weight loss, unspecified abdominal pain, diarrhea EXAM: CT CHEST, ABDOMEN, AND PELVIS WITH CONTRAST TECHNIQUE: Multidetector CT imaging of the chest, abdomen and pelvis was performed following the  standard protocol during bolus administration of intravenous contrast. RADIATION DOSE REDUCTION: This exam was performed according to the departmental dose-optimization program which includes automated exposure control, adjustment of the mA and/or kV according to patient size and/or use of iterative reconstruction technique. CONTRAST:  OMNIPAQUE IOHEXOL 300 MG/ML  SOLN COMPARISON:  None Available. FINDINGS: CT CHEST FINDINGS Cardiovascular: Moderate multi-vessel coronary artery calcification. Global cardiac size is within normal limits. No pericardial effusion. Central pulmonary arteries are enlarged in keeping with changes of pulmonary arterial hypertension. Mild atherosclerotic calcification within the thoracic aorta. No aortic aneurysm.  Mediastinum/Nodes: No enlarged mediastinal, hilar, or axillary lymph nodes. Thyroid gland, trachea, and esophagus demonstrate no significant findings. Lungs/Pleura: Mild emphysema. Trace right pleural effusion. Scattered subpleural pulmonary nodules all measuring less than 3 mm are seen throughout the upper lobes bilaterally, likely post infectious or post inflammatory in nature. No confluent pulmonary infiltrate. No pneumothorax. No central obstructing lesion. Musculoskeletal: No acute bone abnormality. No lytic or blastic bone lesion. AA 5.4 x 8.9 cm macroscopic fatty mass is seen within the right posterior chest wall between the supraspinatus and trapezius musculature compatible with a chest wall lipoma. Coarse central calcification noted. CT ABDOMEN PELVIS FINDINGS Hepatobiliary: A 16 mm hypodense lesion is seen within the inferior right hepatic lobe at axial image # 71/2, indeterminate. Isolated hepatic metastasis is not excluded. Liver otherwise unremarkable. Gallbladder unremarkable. No intra or extrahepatic biliary ductal dilation. Pancreas: Unremarkable unremarkable Spleen: Normal in size without focal abnormality. Adrenals/Urinary Tract: Adrenal glands are  unremarkable. Kidneys are normal, without renal calculi, focal lesion, or hydronephrosis. Bladder is unremarkable. Stomach/Bowel: There is circumferential, irregular mural thickening involving the hepatic flexure of the colon, best seen on axial image # 70/2 and sagittal image # 75/10 suspicious for a annular colonic adenocarcinoma. The mass demonstrates infiltration into the pericolonic soft tissues in keeping with transmural extension and there is obliteration of the intervening fat plane with the adjacent small bowel, best seen on coronal image # 120/8 and axial image # 70/2 suggesting direct invasion of the structure. There is extensive infiltration within the adjacent colonic mesentery. There is extensive pathologic mesenteric, periceliac, periportal, aortocaval, and left periaortic lymphadenopathy. Index lymph node within the periportal lymph node groups measures 3.2 x 6.5 cm at axial image # 59/2. There is a resultant large bowel obstruction with fluid-filled dilated loops of terminal small bowel as well as the cecum. No free intraperitoneal gas or fluid. Stomach and proximal small bowel as well as the distal colon are unremarkable. Vascular/Lymphatic: Extensive aortoiliac atherosclerotic calcification. No aortic aneurysm. Extensive pathologic adenopathy as described above. Reproductive: Prostate is unremarkable. Other: No abdominal wall hernia Musculoskeletal: No acute bone abnormality. No lytic or blastic bone lesion. IMPRESSION: 1. Circumferential, irregular mural thickening involving the hepatic flexure of the colon suspicious for a annular colonic adenocarcinoma. Probable direct invasion of an adjacent loop of small bowel within the right upper quadrant and extensive mesenteric infiltration. Extensive pathologic adenopathy as described above. Possible isolated hepatic metastasis. 2. Resultant large bowel obstruction. 3. 16 mm hypodense lesion within the inferior right hepatic lobe, as noted above,  suspicious for an isolated hepatic metastasis. This could be confirmed with contrast enhanced MRI examination. 4. Moderate multi-vessel coronary artery calcification. 5. Trace right pleural effusion. 6. Scattered subpleural pulmonary nodules all measuring less than 3 mm throughout the upper lobes bilaterally, likely post infectious or post inflammatory in nature. Close attention on subsequent surveillance examination is warranted. 7. 8.9 cm macroscopic fatty mass within the right posterior chest wall between the supraspinatus and trapezius musculature compatible with a chest wall lipoma. Aortic Atherosclerosis (ICD10-I70.0) and Emphysema (ICD10-J43.9). Electronically Signed   By: Helyn Numbers M.D.   On: 10/28/2023 23:07    DISCHARGE EXAMINATION: Vitals:   11/03/23 1340 11/03/23 2110 11/04/23 0455 11/04/23 0847  BP: (!) 146/80 (!) 152/84 125/74 128/85  Pulse: 91 (!) 102 93 (!) 101  Resp:  18 18   Temp: 98.6 F (37 C) 98.7 F (37.1 C) 98 F (36.7 C)   TempSrc: Oral Oral Oral   SpO2: 98% 98% 97%  Weight:      Height:       General appearance: Awake alert.  In no distress Resp: Clear to auscultation bilaterally.  Normal effort Cardio: S1-S2 is normal regular.  No S3-S4.  No rubs murmurs or bruit    DISPOSITION: Home  Discharge Instructions     Call MD for:  difficulty breathing, headache or visual disturbances   Complete by: As directed    Call MD for:  extreme fatigue   Complete by: As directed    Call MD for:  persistant dizziness or light-headedness   Complete by: As directed    Call MD for:  persistant nausea and vomiting   Complete by: As directed    Call MD for:  severe uncontrolled pain   Complete by: As directed    Call MD for:  temperature >100.4   Complete by: As directed    Diet general   Complete by: As directed    Discharge instructions   Complete by: As directed    Please take your medications as prescribed.  Please be sure to follow-up with outpatient  providers as instructed.  You were cared for by a hospitalist during your hospital stay. If you have any questions about your discharge medications or the care you received while you were in the hospital after you are discharged, you can call the unit and asked to speak with the hospitalist on call if the hospitalist that took care of you is not available. Once you are discharged, your primary care physician will handle any further medical issues. Please note that NO REFILLS for any discharge medications will be authorized once you are discharged, as it is imperative that you return to your primary care physician (or establish a relationship with a primary care physician if you do not have one) for your aftercare needs so that they can reassess your need for medications and monitor your lab values. If you do not have a primary care physician, you can call [phone] for a physician referral.   Increase activity slowly   Complete by: As directed          Allergies as of 11/04/2023   No Known Allergies      Medication List     STOP taking these medications    aspirin EC 81 MG tablet   atorvastatin 80 MG tablet Commonly known as: LIPITOR   benzonatate 100 MG capsule Commonly known as: TESSALON   FISH OIL PO   furosemide 40 MG tablet Commonly known as: LASIX   glimepiride 2 MG tablet Commonly known as: AMARYL   loperamide 2 MG capsule Commonly known as: IMODIUM   metFORMIN 1000 MG tablet Commonly known as: GLUCOPHAGE   multivitamin capsule   nitroGLYCERIN 0.4 MG SL tablet Commonly known as: NITROSTAT   Ozempic (0.25 or 0.5 MG/DOSE) 2 MG/1.5ML Sopn Generic drug: Semaglutide(0.25 or 0.5MG /DOS)       TAKE these medications    Accu-Chek FastClix Lancets Misc every other day.   carvedilol 6.25 MG tablet Commonly known as: COREG Take 1 tablet (6.25 mg total) by mouth 2 (two) times daily with a meal. What changed:  medication strength how much to take    cyanocobalamin 500 MCG tablet Commonly known as: VITAMIN B12 Take 500 mcg by mouth daily.   lisinopril 2.5 MG tablet Commonly known as: ZESTRIL Take by mouth.   LORazepam 0.5 MG tablet Commonly known as: ATIVAN Take 1 tablet (0.5 mg total) by mouth every 8 (eight) hours  as needed for anxiety.   melatonin 5 MG Tabs Take 1 tablet (5 mg total) by mouth at bedtime as needed.   methocarbamol 500 MG tablet Commonly known as: ROBAXIN Take 1 tablet (500 mg total) by mouth every 8 (eight) hours as needed for muscle spasms.   oxyCODONE 5 MG immediate release tablet Commonly known as: Oxy IR/ROXICODONE Take 1-2 tablets (5-10 mg total) by mouth every 4 (four) hours as needed for moderate pain (pain score 4-6) or severe pain (pain score 7-10) (5 mg for 4-6/10 pain. 10 mg for >6/10 pain).   pantoprazole 20 MG tablet Commonly known as: PROTONIX Take 1 tablet (20 mg total) by mouth daily.   senna 8.6 MG Tabs tablet Commonly known as: SENOKOT Take 2 tablets (17.2 mg total) by mouth daily.   spironolactone 25 MG tablet Commonly known as: ALDACTONE Take 1 tablet (25 mg total) by mouth daily.          Follow-up Information     Combs, Prince Solian, DO. Call.   Specialty: Geriatric Medicine Why: As needed Contact information: [address] [place] [address] [phone]         Surgery, [place]. Go on 11/13/2023.   Specialty: General Surgery Why: 2 PM - RN visit for staple removal. If hospice is able to remove staples from midline incision then you can call and cancel this appointment. Contact information: [address] [place] [address] [phone]         Manus Rudd, MD. Call.   Specialty: General Surgery Why: As needed for post-operative follow up Contact information: [address] [place] [address] [phone]                 TOTAL DISCHARGE TIME: 35 minutes  Otelia Hettinger Rito Ehrlich  [facility] Pager on [email or web address]  11/04/2023, 10:47 AM

## 2023-11-05 LAB — BPAM FFP
Blood Product Expiration Date: 202411122359
Blood Product Expiration Date: 202411122359
ISSUE DATE / TIME: 202411071023
ISSUE DATE / TIME: 202411071023
Unit Type and Rh: 5100
Unit Type and Rh: 5100

## 2023-11-05 LAB — PREPARE FRESH FROZEN PLASMA
Unit division: 0
Unit division: 0

## 2023-11-23 DEATH — deceased
